# Patient Record
Sex: Male | Born: 1960 | Race: White | Hispanic: No | Marital: Married | Smoking: Current every day smoker
Health system: Southern US, Community
[De-identification: ages and names within clinical notes are randomized; demographics above are authoritative.]

## PROBLEM LIST (undated history)

## (undated) DIAGNOSIS — Z720 Tobacco use: Secondary | ICD-10-CM

## (undated) DIAGNOSIS — K8020 Calculus of gallbladder without cholecystitis without obstruction: Secondary | ICD-10-CM

## (undated) DIAGNOSIS — F32A Depression, unspecified: Secondary | ICD-10-CM

## (undated) DIAGNOSIS — Z7289 Other problems related to lifestyle: Secondary | ICD-10-CM

## (undated) DIAGNOSIS — F329 Major depressive disorder, single episode, unspecified: Secondary | ICD-10-CM

## (undated) DIAGNOSIS — R569 Unspecified convulsions: Secondary | ICD-10-CM

## (undated) DIAGNOSIS — F419 Anxiety disorder, unspecified: Secondary | ICD-10-CM

## (undated) DIAGNOSIS — K8000 Calculus of gallbladder with acute cholecystitis without obstruction: Secondary | ICD-10-CM

---

## 1999-07-18 ENCOUNTER — Ambulatory Visit (HOSPITAL_COMMUNITY): Admission: RE | Admit: 1999-07-18 | Discharge: 1999-07-18 | Payer: Self-pay | Admitting: Emergency Medicine

## 1999-07-18 ENCOUNTER — Encounter: Payer: Self-pay | Admitting: Emergency Medicine

## 2001-05-14 HISTORY — PX: TOTAL HIP ARTHROPLASTY: SHX124

## 2002-05-05 ENCOUNTER — Inpatient Hospital Stay (HOSPITAL_COMMUNITY): Admission: EM | Admit: 2002-05-05 | Discharge: 2002-05-08 | Payer: Self-pay | Admitting: Emergency Medicine

## 2002-05-05 ENCOUNTER — Encounter: Payer: Self-pay | Admitting: Emergency Medicine

## 2002-05-05 ENCOUNTER — Encounter: Payer: Self-pay | Admitting: Orthopedic Surgery

## 2002-11-01 ENCOUNTER — Encounter: Payer: Self-pay | Admitting: Emergency Medicine

## 2002-11-01 ENCOUNTER — Emergency Department (HOSPITAL_COMMUNITY): Admission: EM | Admit: 2002-11-01 | Discharge: 2002-11-01 | Payer: Self-pay | Admitting: Emergency Medicine

## 2004-10-23 ENCOUNTER — Encounter: Admission: RE | Admit: 2004-10-23 | Discharge: 2004-10-23 | Payer: Self-pay | Admitting: Orthopedic Surgery

## 2004-10-24 ENCOUNTER — Ambulatory Visit: Payer: Self-pay | Admitting: Physical Medicine and Rehabilitation

## 2004-10-24 ENCOUNTER — Encounter
Admission: RE | Admit: 2004-10-24 | Discharge: 2005-01-22 | Payer: Self-pay | Admitting: Physical Medicine and Rehabilitation

## 2004-11-24 ENCOUNTER — Encounter
Admission: RE | Admit: 2004-11-24 | Discharge: 2004-11-27 | Payer: Self-pay | Admitting: Physical Medicine and Rehabilitation

## 2010-10-25 ENCOUNTER — Emergency Department (HOSPITAL_COMMUNITY): Payer: Medicare Other

## 2010-10-25 ENCOUNTER — Other Ambulatory Visit: Payer: Self-pay | Admitting: Emergency Medicine

## 2010-10-25 ENCOUNTER — Emergency Department (HOSPITAL_COMMUNITY)
Admission: EM | Admit: 2010-10-25 | Discharge: 2010-10-25 | Disposition: A | Discharge status: Home / Self Care | Payer: Medicare Other | Attending: Emergency Medicine | Admitting: Emergency Medicine

## 2010-10-25 DIAGNOSIS — D72829 Elevated white blood cell count, unspecified: Secondary | ICD-10-CM | POA: Insufficient documentation

## 2010-10-25 DIAGNOSIS — R110 Nausea: Secondary | ICD-10-CM | POA: Insufficient documentation

## 2010-10-25 DIAGNOSIS — R1011 Right upper quadrant pain: Secondary | ICD-10-CM | POA: Insufficient documentation

## 2010-10-25 DIAGNOSIS — Z79899 Other long term (current) drug therapy: Secondary | ICD-10-CM | POA: Insufficient documentation

## 2010-10-25 DIAGNOSIS — G40909 Epilepsy, unspecified, not intractable, without status epilepticus: Secondary | ICD-10-CM | POA: Insufficient documentation

## 2010-10-25 DIAGNOSIS — K8020 Calculus of gallbladder without cholecystitis without obstruction: Secondary | ICD-10-CM | POA: Insufficient documentation

## 2010-10-25 LAB — COMPREHENSIVE METABOLIC PANEL
ALT: 19 U/L (ref 0–53)
AST: 15 U/L (ref 0–37)
Albumin: 4.2 g/dL (ref 3.5–5.2)
Alkaline Phosphatase: 265 U/L — ABNORMAL HIGH (ref 39–117)
BUN: 5 mg/dL — ABNORMAL LOW (ref 6–23)
CO2: 26 mEq/L (ref 19–32)
Calcium: 9.4 mg/dL (ref 8.4–10.5)
Chloride: 103 mEq/L (ref 96–112)
Creatinine, Ser: 0.6 mg/dL (ref 0.4–1.5)
GFR calc Af Amer: >60 mL/min (ref >60)
GFR calc non Af Amer: >60 mL/min (ref >60)
Glucose, Bld: 126 mg/dL — ABNORMAL HIGH (ref 70–99)
Potassium: 3.4 mEq/L — ABNORMAL LOW (ref 3.5–5.1)
Sodium: 138 mEq/L (ref 135–145)
Total Bilirubin: 0.2 mg/dL — ABNORMAL LOW (ref 0.3–1.2)
Total Protein: 7.8 g/dL (ref 6.0–8.3)

## 2010-10-25 LAB — DIFFERENTIAL
Basophils Absolute: 0.1 10*3/uL (ref 0.0–0.1)
Basophils Relative: 0 % (ref 0–1)
Eosinophils Absolute: 0.2 10*3/uL (ref 0.0–0.7)
Eosinophils Relative: 2 % (ref 0–5)
Lymphocytes Relative: 18 % (ref 12–46)
Lymphs Abs: 2.8 K/uL (ref 0.7–4.0)
Monocytes Absolute: 1.1 10*3/uL — ABNORMAL HIGH (ref 0.1–1.0)
Monocytes Relative: 8 % (ref 3–12)
Neutro Abs: 10.7 10*3/uL — ABNORMAL HIGH (ref 1.7–7.7)
Neutrophils Relative %: 72 % (ref 43–77)

## 2010-10-25 LAB — CBC
HCT: 41.4 % (ref 39.0–52.0)
Hemoglobin: 15 g/dL (ref 13.0–17.0)
MCH: 32.3 pg (ref 26.0–34.0)
MCHC: 36.2 g/dL — ABNORMAL HIGH (ref 30.0–36.0)
MCV: 89 fL (ref 78.0–100.0)
Platelets: 166 K/uL (ref 150–400)
RBC: 4.65 MIL/uL (ref 4.22–5.81)
RDW: 13.4 % (ref 11.5–15.5)
WBC: 14.9 10*3/uL — ABNORMAL HIGH (ref 4.0–10.5)

## 2010-10-25 LAB — URINALYSIS, ROUTINE W REFLEX MICROSCOPIC
Glucose, UA: NEGATIVE mg/dL
Ketones, ur: NEGATIVE mg/dL
pH: 6 (ref 5.0–8.0)

## 2010-10-25 LAB — URINE MICROSCOPIC-ADD ON

## 2010-10-25 LAB — LIPASE, BLOOD: Lipase: 18 U/L (ref 11–59)

## 2010-10-25 LAB — ETHANOL: Alcohol, Ethyl (B): <11 mg/dL — ABNORMAL HIGH (ref 0–10)

## 2010-10-25 MED ORDER — TECHNETIUM TC 99M MEBROFENIN IV KIT
5.0000 | PACK | Freq: Once | INTRAVENOUS | Status: AC | PRN
  Administered 2010-10-25: 5.5 via INTRAVENOUS

## 2010-11-09 NOTE — Consult Note (Signed)
NAMECLINTON, Aaron Vang NO.:  [id number]  MEDICAL RECORD NO.:  [id number]  LOCATION:  [facility]                         FACILITY:  [facility]  PHYSICIAN:  Ardeth Sportsman, MD     DATE OF BIRTH:  [birth year]  DATE OF CONSULTATION:  10/25/2010 DATE OF DISCHARGE:  10/25/2010                                CONSULTATION   REFERRING PHYSICIAN:  Gavin Pound. Ghim, MD  PHYSICIAN PRIMARY CARE:  None.  REASON FOR CONSULTATION:  Flank pain or right upper quadrant pain.  BRIEF HISTORY:  The patient is a 50 year old white male who presented with abdominal pain that started last night around midnight.  Pain is ongoing since midnight.  He denied nausea, vomiting, diarrhea, constipation, or GERD.  His last meal was about at 2:00 p.m.  Pain is only relieved by analgesics.  He has had a fair amount of analgesics. He has a history of a right trochanter femur fracture with postoperative anemia in 2004, history of seizure disorder since age 29, history of tobacco use, and alcohol dependence.  PAST SURGICAL HISTORY:  Right hip, 2004.  FAMILY HISTORY:  Mother is deceased.  He does not know the cause. Father and brother both living and "okay."  One sister is deceased, two living in good health.  SOCIAL HISTORY:  Tobacco, 2 packs a day for 40 years.  Alcohol, positive history and still drinking.  Drugs, none.  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  REVIEW OF SYSTEMS:  CONSTITUTIONAL:  Fever.  He said he had sweats but no documented fever.  SKIN:  No changes.  His last seizure was 12 years ago.  CVS:  Negative for syncope, presyncope, or stroke.  PULMONARY:  No orthopnea, PND, dyspnea on exertion, coughing or wheezing.  CARDIAC:  No chest pain.  GI:  Negative as above.  GU:  No trouble voiding.  LOWER EXTREMITIES:  No edema.  No claudication.  MUSCULOSKELETAL:  No joint problems.  He has chronic problems with his hip fracture site and walks with a cane.   ENDOCRINE:  No diabetes or thyroid issues.  CURRENT MEDICATIONS: 1. Dilantin 200 mg at bedtime. 2. Zoloft 100 mg at bedtime.  The patient has received a combination drugs including fentanyl, Toradol, and Dilaudid so far.  ALLERGIES:  None.  PHYSICAL EXAMINATION:  VITAL SIGNS:  Admission blood pressure is 150/76, repeated is again 150/76, heart rate is in the 50s, temperature is 98.6, respiratory rate was 20, and sats were 97% on room air. GENERAL:  On entering the room, the patient was kind of rolling back and forth in bed.  Next, I asked him if he had been drinking, he denied it at that time. HEAD:  Normocephalic. EARS, NOSE, THROAT, AND MOUTH:  Within normal limits. NECK:  Trachea is in the midline.  No palpable thyromegaly, no rales, or rhonchi. CARDIAC:  Normal S1-S2.  No murmur or rub.  Pulses are +2 and equal. ABDOMEN:  Bowel sounds are present.  Abdomen is nondistended.  He has tenderness which is kind of mid-lateral right upper quadrant tenderness. He is not tender in the right upper quadrant where we  expect his gallbladder tend to be.  Hernias none, masses none, and abscesses none. GU/RECTAL:  Deferred. LYMPHADENOPATHY:  None palpated, cervical or inguinal. MUSCULOSKELETAL:  No joint changes noted. SKIN:  No changes. NEUROLOGIC:  He is oriented and answering questions.  No focal changes noted.  Cranial nerves II-XII are grossly normal. PSYCH:  He has a slightly abnormal affect but as noted above has received fair amount of narcotics.  LABORATORY DATA:  White count is 14.9, hemoglobin is 15, hematocrit is 41, and platelets 166,000.  Total bilirubin 0.2, alk phos is elevated at 265, SGOT is 15, and SGPT is 19.  Sodium is 138, potassium is 3.4, chloride is 103, CO2 is 26, creatinine is 0.6, and BUN is 5.  UA showed 3-6 white cells and 0-2 red cells.  Lipase was 18.  A noncontrast CT showed hydropic gallbladder.  No calcified stones identified.  Common bile duct appeared  normal without dilatation.  The spleen was normal. Both adrenals were normal.  There was some nonspecific bilateral perinephric stranding.  Dr. Michaell Cowing reviewed the CT with Radiology and it was not very impressive.  A ultrasound of the abdomen and HIDA scan were recommended with plans to followup after the ER has completed those studies.  We do note that the ultrasound was obtained and showed multiple gallstones, the gallbladder wall was normal, there was no pericholecystic fluid, Murphy sign was normal, and common bile duct was normal.  IMPRESSION:  Abdominal pain with dilated gallbladder, elevated white count, and alk phos.  When we saw him, we asked the ER doctor to obtain abdominal ultrasound and HIDA scan for more information with plans to followup after those are completed.     Eber Hong, P.A.   ______________________________ Ardeth Sportsman, MD    WDJ/MEDQ  D:  10/25/2010  T:  10/25/2010  Job:  [id number]  Electronically Signed by Sherrie George P.A. on 11/03/2010 09:35:21 PM Electronically Signed by Karie Soda MD on 11/09/2010 07:14:03 AM

## 2010-11-14 ENCOUNTER — Ambulatory Visit (INDEPENDENT_AMBULATORY_CARE_PROVIDER_SITE_OTHER): Payer: Medicare Other | Admitting: General Surgery

## 2011-12-31 ENCOUNTER — Inpatient Hospital Stay (HOSPITAL_COMMUNITY): Payer: Medicare Other

## 2011-12-31 ENCOUNTER — Encounter (HOSPITAL_COMMUNITY)
Admission: EM | Disposition: A | Discharge status: Home / Self Care | Payer: Self-pay | Source: Home / Self Care | Attending: General Surgery

## 2011-12-31 ENCOUNTER — Inpatient Hospital Stay (HOSPITAL_COMMUNITY)
Admission: EM | Admit: 2011-12-31 | Discharge: 2012-01-01 | DRG: 418 | Disposition: A | Discharge status: Home / Self Care | Payer: Medicare Other | Attending: General Surgery | Admitting: General Surgery

## 2011-12-31 ENCOUNTER — Encounter (HOSPITAL_COMMUNITY): Payer: Self-pay | Admitting: Critical Care Medicine

## 2011-12-31 ENCOUNTER — Encounter (HOSPITAL_COMMUNITY): Payer: Self-pay | Admitting: *Deleted

## 2011-12-31 ENCOUNTER — Encounter (HOSPITAL_COMMUNITY): Payer: Self-pay | Admitting: General Surgery

## 2011-12-31 ENCOUNTER — Inpatient Hospital Stay (HOSPITAL_COMMUNITY): Payer: Medicare Other | Admitting: Critical Care Medicine

## 2011-12-31 ENCOUNTER — Emergency Department (HOSPITAL_COMMUNITY): Payer: Medicare Other

## 2011-12-31 DIAGNOSIS — F1010 Alcohol abuse, uncomplicated: Secondary | ICD-10-CM | POA: Diagnosis present

## 2011-12-31 DIAGNOSIS — F329 Major depressive disorder, single episode, unspecified: Secondary | ICD-10-CM | POA: Diagnosis present

## 2011-12-31 DIAGNOSIS — Z79899 Other long term (current) drug therapy: Secondary | ICD-10-CM

## 2011-12-31 DIAGNOSIS — K821 Hydrops of gallbladder: Secondary | ICD-10-CM | POA: Diagnosis present

## 2011-12-31 DIAGNOSIS — Z8659 Personal history of other mental and behavioral disorders: Secondary | ICD-10-CM

## 2011-12-31 DIAGNOSIS — G40909 Epilepsy, unspecified, not intractable, without status epilepticus: Secondary | ICD-10-CM | POA: Diagnosis present

## 2011-12-31 DIAGNOSIS — Z7289 Other problems related to lifestyle: Secondary | ICD-10-CM

## 2011-12-31 DIAGNOSIS — Z8669 Personal history of other diseases of the nervous system and sense organs: Secondary | ICD-10-CM

## 2011-12-31 DIAGNOSIS — K8000 Calculus of gallbladder with acute cholecystitis without obstruction: Principal | ICD-10-CM | POA: Diagnosis present

## 2011-12-31 DIAGNOSIS — F3289 Other specified depressive episodes: Secondary | ICD-10-CM | POA: Diagnosis present

## 2011-12-31 DIAGNOSIS — F32A Depression, unspecified: Secondary | ICD-10-CM | POA: Diagnosis present

## 2011-12-31 DIAGNOSIS — Z789 Other specified health status: Secondary | ICD-10-CM

## 2011-12-31 DIAGNOSIS — Z720 Tobacco use: Secondary | ICD-10-CM | POA: Diagnosis present

## 2011-12-31 DIAGNOSIS — K8010 Calculus of gallbladder with chronic cholecystitis without obstruction: Secondary | ICD-10-CM

## 2011-12-31 DIAGNOSIS — K812 Acute cholecystitis with chronic cholecystitis: Secondary | ICD-10-CM

## 2011-12-31 DIAGNOSIS — E669 Obesity, unspecified: Secondary | ICD-10-CM | POA: Diagnosis present

## 2011-12-31 DIAGNOSIS — F411 Generalized anxiety disorder: Secondary | ICD-10-CM | POA: Diagnosis present

## 2011-12-31 DIAGNOSIS — F17200 Nicotine dependence, unspecified, uncomplicated: Secondary | ICD-10-CM | POA: Diagnosis present

## 2011-12-31 DIAGNOSIS — F1090 Alcohol use, unspecified, uncomplicated: Secondary | ICD-10-CM

## 2011-12-31 HISTORY — DX: Other problems related to lifestyle: Z72.89

## 2011-12-31 HISTORY — DX: Calculus of gallbladder without cholecystitis without obstruction: K80.20

## 2011-12-31 HISTORY — DX: Depression, unspecified: F32.A

## 2011-12-31 HISTORY — DX: Alcohol use, unspecified, uncomplicated: F10.90

## 2011-12-31 HISTORY — DX: Other specified health status: Z78.9

## 2011-12-31 HISTORY — DX: Tobacco use: Z72.0

## 2011-12-31 HISTORY — DX: Anxiety disorder, unspecified: F41.9

## 2011-12-31 HISTORY — DX: Calculus of gallbladder with acute cholecystitis without obstruction: K80.00

## 2011-12-31 HISTORY — DX: Major depressive disorder, single episode, unspecified: F32.9

## 2011-12-31 HISTORY — DX: Unspecified convulsions: R56.9

## 2011-12-31 HISTORY — PX: CHOLECYSTECTOMY: SHX55

## 2011-12-31 LAB — CBC WITH DIFFERENTIAL/PLATELET
Basophils Absolute: 0 10*3/uL (ref 0.0–0.1)
HCT: 40.4 % (ref 39.0–52.0)
Lymphocytes Relative: 17 % (ref 12–46)
Monocytes Absolute: 0.6 10*3/uL (ref 0.1–1.0)
Neutro Abs: 9.8 10*3/uL — ABNORMAL HIGH (ref 1.7–7.7)
Neutrophils Relative %: 77 % (ref 43–77)
RDW: 14.2 % (ref 11.5–15.5)
WBC: 12.8 10*3/uL — ABNORMAL HIGH (ref 4.0–10.5)

## 2011-12-31 LAB — HEPATIC FUNCTION PANEL
ALT: 12 U/L (ref 0–53)
AST: 16 U/L (ref 0–37)
Albumin: 4.2 g/dL (ref 3.5–5.2)
Total Protein: 8 g/dL (ref 6.0–8.3)

## 2011-12-31 LAB — POCT I-STAT, CHEM 8
BUN: 5 mg/dL — ABNORMAL LOW (ref 6–23)
Chloride: 102 mEq/L (ref 96–112)
Creatinine, Ser: 0.7 mg/dL (ref 0.50–1.35)
Glucose, Bld: 121 mg/dL — ABNORMAL HIGH (ref 70–99)
Potassium: 3.6 mEq/L (ref 3.5–5.1)

## 2011-12-31 LAB — URINALYSIS, ROUTINE W REFLEX MICROSCOPIC
Bilirubin Urine: NEGATIVE
Specific Gravity, Urine: 1.006 (ref 1.005–1.030)
pH: 6 (ref 5.0–8.0)

## 2011-12-31 LAB — URINE MICROSCOPIC-ADD ON

## 2011-12-31 SURGERY — LAPAROSCOPIC CHOLECYSTECTOMY WITH INTRAOPERATIVE CHOLANGIOGRAM
Anesthesia: General | Site: Abdomen | Wound class: Contaminated

## 2011-12-31 MED ORDER — HYDROMORPHONE HCL PF 1 MG/ML IJ SOLN
INTRAMUSCULAR | Status: AC
  Filled 2011-12-31: qty 1

## 2011-12-31 MED ORDER — ONDANSETRON HCL 4 MG/2ML IJ SOLN
INTRAMUSCULAR | Status: DC | PRN
  Administered 2011-12-31: 4 mg via INTRAVENOUS

## 2011-12-31 MED ORDER — GLYCOPYRROLATE 0.2 MG/ML IJ SOLN
INTRAMUSCULAR | Status: DC | PRN
  Administered 2011-12-31: .7 mg via INTRAVENOUS
  Administered 2011-12-31: 0.1 mg via INTRAVENOUS

## 2011-12-31 MED ORDER — ONDANSETRON HCL 4 MG/2ML IJ SOLN
4.0000 mg | Freq: Once | INTRAMUSCULAR | Status: AC
  Administered 2011-12-31: 4 mg via INTRAVENOUS
  Filled 2011-12-31: qty 2

## 2011-12-31 MED ORDER — CIPROFLOXACIN IN D5W 400 MG/200ML IV SOLN
400.0000 mg | Freq: Two times a day (BID) | INTRAVENOUS | Status: DC
  Administered 2011-12-31: 400 mg via INTRAVENOUS
  Filled 2011-12-31 (×2): qty 200

## 2011-12-31 MED ORDER — LIDOCAINE HCL (CARDIAC) 20 MG/ML IV SOLN
INTRAVENOUS | Status: DC | PRN
  Administered 2011-12-31: 80 mg via INTRAVENOUS

## 2011-12-31 MED ORDER — DIPHENHYDRAMINE HCL 50 MG/ML IJ SOLN: 12.5000 mg | Freq: Four times a day (QID) | INTRAMUSCULAR | Status: DC | PRN

## 2011-12-31 MED ORDER — FENTANYL CITRATE 0.05 MG/ML IJ SOLN
INTRAMUSCULAR | Status: DC | PRN
  Administered 2011-12-31: 50 ug via INTRAVENOUS
  Administered 2011-12-31 (×2): 100 ug via INTRAVENOUS

## 2011-12-31 MED ORDER — HYDROMORPHONE HCL PF 1 MG/ML IJ SOLN
0.2500 mg | INTRAMUSCULAR | Status: DC | PRN
  Administered 2011-12-31 (×4): 0.5 mg via INTRAVENOUS

## 2011-12-31 MED ORDER — PROMETHAZINE HCL 25 MG/ML IJ SOLN: 6.2500 mg | INTRAMUSCULAR | Status: DC | PRN

## 2011-12-31 MED ORDER — ACETAMINOPHEN 325 MG PO TABS
650.0000 mg | ORAL_TABLET | Freq: Four times a day (QID) | ORAL | Status: DC | PRN
  Administered 2011-12-31 (×2): 650 mg via ORAL
  Filled 2011-12-31 (×2): qty 2

## 2011-12-31 MED ORDER — BUPIVACAINE-EPINEPHRINE PF 0.25-1:200000 % IJ SOLN
INTRAMUSCULAR | Status: AC
  Filled 2011-12-31: qty 30

## 2011-12-31 MED ORDER — SERTRALINE HCL 100 MG PO TABS
100.0000 mg | ORAL_TABLET | Freq: Every day | ORAL | Status: DC
  Administered 2011-12-31: 100 mg via ORAL
  Filled 2011-12-31 (×2): qty 1

## 2011-12-31 MED ORDER — NICOTINE 14 MG/24HR TD PT24
14.0000 mg | MEDICATED_PATCH | Freq: Every day | TRANSDERMAL | Status: DC
  Administered 2011-12-31 – 2012-01-01 (×2): 14 mg via TRANSDERMAL
  Filled 2011-12-31 (×2): qty 1

## 2011-12-31 MED ORDER — HYDROMORPHONE HCL PF 1 MG/ML IJ SOLN
0.5000 mg | INTRAMUSCULAR | Status: DC | PRN
  Administered 2011-12-31: 1 mg via INTRAVENOUS
  Filled 2011-12-31: qty 1

## 2011-12-31 MED ORDER — LACTATED RINGERS IV SOLN
INTRAVENOUS | Status: DC
  Administered 2011-12-31: 11:00:00 via INTRAVENOUS

## 2011-12-31 MED ORDER — PHENYTOIN SODIUM EXTENDED 100 MG PO CAPS
100.0000 mg | ORAL_CAPSULE | Freq: Two times a day (BID) | ORAL | Status: DC
  Administered 2011-12-31: 100 mg via ORAL
  Filled 2011-12-31 (×3): qty 1

## 2011-12-31 MED ORDER — SODIUM CHLORIDE 0.9 % IV SOLN
Freq: Once | INTRAVENOUS | Status: AC
  Administered 2011-12-31: 1000 mL via INTRAVENOUS

## 2011-12-31 MED ORDER — PROPOFOL 10 MG/ML IV EMUL
INTRAVENOUS | Status: DC | PRN
  Administered 2011-12-31: 180 mg via INTRAVENOUS

## 2011-12-31 MED ORDER — MEPERIDINE HCL 25 MG/ML IJ SOLN: 6.2500 mg | INTRAMUSCULAR | Status: DC | PRN

## 2011-12-31 MED ORDER — LORAZEPAM 2 MG/ML IJ SOLN: 0.5000 mg | Freq: Four times a day (QID) | INTRAMUSCULAR | Status: DC | PRN

## 2011-12-31 MED ORDER — LACTATED RINGERS IV SOLN
INTRAVENOUS | Status: DC | PRN
  Administered 2011-12-31: 11:00:00 via INTRAVENOUS

## 2011-12-31 MED ORDER — CIPROFLOXACIN IN D5W 400 MG/200ML IV SOLN
400.0000 mg | Freq: Two times a day (BID) | INTRAVENOUS | Status: AC
  Administered 2011-12-31 – 2012-01-01 (×2): 400 mg via INTRAVENOUS
  Filled 2011-12-31 (×2): qty 200

## 2011-12-31 MED ORDER — 0.9 % SODIUM CHLORIDE (POUR BTL) OPTIME
TOPICAL | Status: DC | PRN
  Administered 2011-12-31: 1000 mL

## 2011-12-31 MED ORDER — KCL IN DEXTROSE-NACL 30-5-0.45 MEQ/L-%-% IV SOLN
INTRAVENOUS | Status: DC
  Administered 2011-12-31: 16:00:00 via INTRAVENOUS
  Administered 2012-01-01: 1000 mL via INTRAVENOUS
  Filled 2011-12-31 (×4): qty 1000

## 2011-12-31 MED ORDER — SUCCINYLCHOLINE CHLORIDE 20 MG/ML IJ SOLN
INTRAMUSCULAR | Status: DC | PRN
  Administered 2011-12-31: 100 mg via INTRAVENOUS

## 2011-12-31 MED ORDER — HYDROMORPHONE HCL PF 1 MG/ML IJ SOLN
1.0000 mg | Freq: Once | INTRAMUSCULAR | Status: AC
  Administered 2011-12-31: 1 mg via INTRAVENOUS
  Filled 2011-12-31: qty 1

## 2011-12-31 MED ORDER — DIPHENHYDRAMINE HCL 12.5 MG/5ML PO ELIX
12.5000 mg | ORAL_SOLUTION | Freq: Four times a day (QID) | ORAL | Status: DC | PRN
  Filled 2011-12-31: qty 5

## 2011-12-31 MED ORDER — MIDAZOLAM HCL 5 MG/5ML IJ SOLN
INTRAMUSCULAR | Status: DC | PRN
  Administered 2011-12-31: 2 mg via INTRAVENOUS

## 2011-12-31 MED ORDER — SODIUM CHLORIDE 0.9 % IV SOLN
INTRAVENOUS | Status: DC | PRN
  Administered 2011-12-31: 12:00:00

## 2011-12-31 MED ORDER — NEOSTIGMINE METHYLSULFATE 1 MG/ML IJ SOLN
INTRAMUSCULAR | Status: DC | PRN
  Administered 2011-12-31: 4 mg via INTRAVENOUS

## 2011-12-31 MED ORDER — HYDROCODONE-ACETAMINOPHEN 5-325 MG PO TABS
1.0000 | ORAL_TABLET | ORAL | Status: DC | PRN
  Administered 2012-01-01: 2 via ORAL
  Filled 2011-12-31: qty 2

## 2011-12-31 MED ORDER — BUPIVACAINE-EPINEPHRINE 0.25% -1:200000 IJ SOLN
INTRAMUSCULAR | Status: DC | PRN
  Administered 2011-12-31: 16 mL

## 2011-12-31 MED ORDER — SODIUM CHLORIDE 0.9 % IR SOLN
Status: DC | PRN
  Administered 2011-12-31: 1000 mL

## 2011-12-31 MED ORDER — ROCURONIUM BROMIDE 100 MG/10ML IV SOLN
INTRAVENOUS | Status: DC | PRN
  Administered 2011-12-31: 30 mg via INTRAVENOUS

## 2011-12-31 MED ORDER — ONDANSETRON HCL 4 MG/2ML IJ SOLN: 4.0000 mg | Freq: Four times a day (QID) | INTRAMUSCULAR | Status: DC | PRN

## 2011-12-31 MED ORDER — ACETAMINOPHEN 650 MG RE SUPP: 650.0000 mg | Freq: Four times a day (QID) | RECTAL | Status: DC | PRN

## 2011-12-31 SURGICAL SUPPLY — 54 items
ADH SKN CLS APL DERMABOND .7 (GAUZE/BANDAGES/DRESSINGS)
ADH SKN CLS LQ APL DERMABOND (GAUZE/BANDAGES/DRESSINGS) ×1
APPLIER CLIP 5 13 M/L LIGAMAX5 (MISCELLANEOUS) ×2
APPLIER CLIP ROT 10 11.4 M/L (STAPLE)
APR CLP MED LRG 11.4X10 (STAPLE)
APR CLP MED LRG 5 ANG JAW (MISCELLANEOUS) ×1
BAG SPEC RTRVL LRG 6X4 10 (ENDOMECHANICALS) ×1
BLADE SURG ROTATE 9660 (MISCELLANEOUS) ×1 IMPLANT
CANISTER SUCTION 2500CC (MISCELLANEOUS) ×2 IMPLANT
CHLORAPREP W/TINT 26ML (MISCELLANEOUS) ×2 IMPLANT
CLIP APPLIE 5 13 M/L LIGAMAX5 (MISCELLANEOUS) ×1 IMPLANT
CLIP APPLIE ROT 10 11.4 M/L (STAPLE) IMPLANT
CLOTH BEACON ORANGE TIMEOUT ST (SAFETY) ×2 IMPLANT
COVER MAYO STAND STRL (DRAPES) ×2 IMPLANT
COVER SURGICAL LIGHT HANDLE (MISCELLANEOUS) ×2 IMPLANT
DECANTER SPIKE VIAL GLASS SM (MISCELLANEOUS) ×2 IMPLANT
DERMABOND ADHESIVE PROPEN (GAUZE/BANDAGES/DRESSINGS) ×1
DERMABOND ADVANCED (GAUZE/BANDAGES/DRESSINGS)
DERMABOND ADVANCED .7 DNX12 (GAUZE/BANDAGES/DRESSINGS) ×1 IMPLANT
DERMABOND ADVANCED .7 DNX6 (GAUZE/BANDAGES/DRESSINGS) IMPLANT
DRAPE C-ARM 42X72 X-RAY (DRAPES) ×2 IMPLANT
DRAPE UTILITY 15X26 W/TAPE STR (DRAPE) ×4 IMPLANT
ELECT REM PT RETURN 9FT ADLT (ELECTROSURGICAL) ×2
ELECTRODE REM PT RTRN 9FT ADLT (ELECTROSURGICAL) ×1 IMPLANT
FILTER SMOKE EVAC LAPAROSHD (FILTER) IMPLANT
GLOVE BIO SURGEON STRL SZ8 (GLOVE) ×2 IMPLANT
GLOVE BIOGEL PI IND STRL 7.0 (GLOVE) IMPLANT
GLOVE BIOGEL PI IND STRL 7.5 (GLOVE) IMPLANT
GLOVE BIOGEL PI IND STRL 8 (GLOVE) ×1 IMPLANT
GLOVE BIOGEL PI INDICATOR 7.0 (GLOVE) ×2
GLOVE BIOGEL PI INDICATOR 7.5 (GLOVE) ×1
GLOVE BIOGEL PI INDICATOR 8 (GLOVE) ×1
GLOVE ECLIPSE 6.5 STRL STRAW (GLOVE) ×1 IMPLANT
GLOVE SURG SS PI 7.0 STRL IVOR (GLOVE) ×1 IMPLANT
GOWN PREVENTION PLUS XLARGE (GOWN DISPOSABLE) ×2 IMPLANT
GOWN STRL NON-REIN LRG LVL3 (GOWN DISPOSABLE) ×5 IMPLANT
KIT BASIN OR (CUSTOM PROCEDURE TRAY) ×2 IMPLANT
KIT ROOM TURNOVER OR (KITS) ×2 IMPLANT
NS IRRIG 1000ML POUR BTL (IV SOLUTION) ×2 IMPLANT
PAD ARMBOARD 7.5X6 YLW CONV (MISCELLANEOUS) ×2 IMPLANT
POUCH SPECIMEN RETRIEVAL 10MM (ENDOMECHANICALS) ×2 IMPLANT
SCISSORS LAP 5X35 DISP (ENDOMECHANICALS) ×1 IMPLANT
SET CHOLANGIOGRAPH 5 50 .035 (SET/KITS/TRAYS/PACK) ×2 IMPLANT
SET IRRIG TUBING LAPAROSCOPIC (IRRIGATION / IRRIGATOR) ×2 IMPLANT
SLEEVE ADV FIXATION 5X100MM (TROCAR) ×4 IMPLANT
SPECIMEN JAR SMALL (MISCELLANEOUS) ×2 IMPLANT
SUT VIC AB 4-0 PS2 27 (SUTURE) ×2 IMPLANT
TOWEL OR 17X24 6PK STRL BLUE (TOWEL DISPOSABLE) ×2 IMPLANT
TOWEL OR 17X26 10 PK STRL BLUE (TOWEL DISPOSABLE) ×2 IMPLANT
TRAY LAPAROSCOPIC (CUSTOM PROCEDURE TRAY) ×2 IMPLANT
TROCAR HASSON GELL 12X100 (TROCAR) ×2 IMPLANT
TROCAR Z-THREAD FIOS 11X100 BL (TROCAR) IMPLANT
TROCAR Z-THREAD FIOS 5X100MM (TROCAR) ×4 IMPLANT
WATER STERILE IRR 1000ML POUR (IV SOLUTION) IMPLANT

## 2011-12-31 NOTE — Preoperative (Signed)
Beta Blockers   Reason not to administer Beta Blockers:Not Applicable 

## 2011-12-31 NOTE — ED Provider Notes (Signed)
Medical screening examination/treatment/procedure(s) were performed by non-physician practitioner and as supervising physician I was immediately available for consultation/collaboration.   Lyanne Co, MD 12/31/11 [phone]

## 2011-12-31 NOTE — Discharge Summary (Signed)
Physician Discharge Summary  Patient ID: Aaron Vang MRN: [id number] DOB/AGE: 51/22/1962 51 y.o.  Admit date: 12/31/2011 Discharge date: 01/01/2012  Admission Diagnoses: 1.Acute cholecystitis with cholelithiasis 2. History of seizures none for 20 years on chronic Dilantin.  3. History of anxiety and depression; some memory issues. On chronic Zoloft.  4. History of alcohol use  5. Ongoing tobacco use, 80+ pack year.  Discharge Diagnoses: Same Principal Problem:  *Cholecystitis, acute with cholelithiasis Active Problems:  Hx of seizure disorder  Hx of anxiety disorder  Depression  Alcohol use  Tobacco use   PROCEDURES: Laparoscopic cholecystectomy with IOC 12/29/11 Dr. Gloris Ham Course:  Patient is a 51 year old gentleman who presents to the ER. He started having pain around 7:30 PM. He last eaten at 3 PM. He went to bed at 9:00 after taking a couple ibuprofen. His symptoms did not improve he got up and took more ibuprofen and later in the evening early morning took something that his dosage given for pain. Pain was ongoing he came to the ER around 6:30 this morning. Even with IV Dilaudid he continues to have pain, and his right upper quadrant going to his back. He is extremely tender right upper quadrant. He was seen in June 2000 12th a similar problem ultrasound 10/25/2010 shows multiple gallstones with a normal gallbladder wall negative Murphy's sign common bile duct was 4 mm. There was a small hemangioma noted on the liver portion of the exam. A HIDA scan showed no uptake by the gallbladder faint distention with morphine which was suggested chronic cholecystitis. It was recommended he follow up as an outpatient this did not occur. He's had a similar episode 3-4 months ago, and now this is his third episode which is his worst. Workup in the emergency room shows a white count of 12,800. Alkaline phosphatase elevated at 198. Lipase is normal at 17. Ultrasound shows multiple  gallstones within the gallbladder under 1 cm in size no wall thickening patient does have a sonographic Murphy's sign. No pericholecystic fluid. Common bile duct was 7 mm no ductal stones identified he is a 9 mm simple cyst of the liver central portion of centimeter hyperechoic focus likely direct present a small hemangioma was their impression and gallbladder stones and sludge with a positive Murphy sign. He's been seen and evaluated by Dr. Janee Morn, who agrees he should undergo cholecystectomy today. Risks and benefits were discussed the patient is agreeable. He has a sister and his ministers in the room. Patient and sister agreeable to surgery. He was taken to the OR later that day and tolerated the procedure well.  His diet is being advanced and if he does well we hope to send home later today.  Condition on D/C:  improved  Disposition: 01-Home or Self Care   Medication List  As of 01/01/2012  8:54 AM   TAKE these medications         acetaminophen 325 MG tablet   Commonly known as: TYLENOL   Take 2 tablets (650 mg total) by mouth every 6 (six) hours as needed (or Temp > 100).      HYDROcodone-acetaminophen 5-325 MG per tablet   Commonly known as: NORCO/VICODIN   Take 1-2 tablets by mouth every 4 (four) hours as needed (pain).      ibuprofen 200 MG tablet   Commonly known as: ADVIL,MOTRIN   Take 600 mg by mouth every 6 (six) hours as needed. For pain      phenytoin 100  MG ER capsule   Commonly known as: DILANTIN   Take 100 mg by mouth 2 (two) times daily.      sertraline 100 MG tablet   Commonly known as: ZOLOFT   Take 100 mg by mouth daily.      ZYRTEC ALLERGY 10 MG tablet   Generic drug: cetirizine   Take 10 mg by mouth 2 (two) times daily.           Follow-up Information    Follow up with [facility], MD. Schedule an appointment as soon as possible for a visit in 2 weeks. (Call for an appointment in 2-3 weeks)    Contact information:   [address] [place] [address] [phone]       Follow up with [facility], MD. (Call Dr. Acquanetta Belling and let him know you have had this surgery, and that you are having trouble with your anxiety and depression.)    Contact information:   [address] [place] [address] [phone]          Signed: Sherrie George 01/01/2012, 8:54 AM

## 2011-12-31 NOTE — Anesthesia Preprocedure Evaluation (Addendum)
Anesthesia Evaluation  Patient identified by MRN, date of birth, ID band Patient awake    Reviewed: Allergy & Precautions, H&P , NPO status , Patient's Chart, lab work & pertinent test results  History of Anesthesia Complications Negative for: history of anesthetic complications  Airway Mallampati: II  Neck ROM: Full    Dental  (+) Poor Dentition, Chipped and Missing Multiple rotten teeth:   Pulmonary Current Smoker,  breath sounds clear to auscultation        Cardiovascular negative cardio ROS  Rhythm:Regular Rate:Normal     Neuro/Psych Seizures -,  PSYCHIATRIC DISORDERS Anxiety Depression    GI/Hepatic Neg liver ROS,   Endo/Other  negative endocrine ROS  Renal/GU negative Renal ROS     Musculoskeletal   Abdominal (+) + obese,   Peds  Hematology negative hematology ROS (+)   Anesthesia Other Findings   Reproductive/Obstetrics                          Anesthesia Physical Anesthesia Plan  ASA: II and Emergent  Anesthesia Plan: General   Post-op Pain Management:    Induction: Intravenous  Airway Management Planned: Oral ETT  Additional Equipment:   Intra-op Plan:   Post-operative Plan: Extubation in OR  Informed Consent: I have reviewed the patients History and Physical, chart, labs and discussed the procedure including the risks, benefits and alternatives for the proposed anesthesia with the patient or authorized representative who has indicated his/her understanding and acceptance.   Dental advisory given  Plan Discussed with: Anesthesiologist, Surgeon and CRNA  Anesthesia Plan Comments:        Anesthesia Quick Evaluation

## 2011-12-31 NOTE — Transfer of Care (Signed)
Immediate Anesthesia Transfer of Care Note  Patient: Aaron Vang  Procedure(s) Performed: Procedure(s) (LRB): LAPAROSCOPIC CHOLECYSTECTOMY WITH INTRAOPERATIVE CHOLANGIOGRAM (N/A)  Patient Location: PACU  Anesthesia Type: General  Level of Consciousness: awake and alert   Airway & Oxygen Therapy: Patient Spontanous Breathing and Patient connected to nasal cannula oxygen  Post-op Assessment: Report given to PACU RN, Post -op Vital signs reviewed and stable and Patient moving all extremities X 4  Post vital signs: Reviewed and stable  Complications: No apparent anesthesia complications

## 2011-12-31 NOTE — H&P (Signed)
I discussed the procedure in detail.  We discussed the risks and benefits of a laparoscopic cholecystectomy and possible cholangiogram including, but not limited to bleeding, infection, injury to surrounding structures such as the intestine or liver, bile leak, retained gallstones, need to convert to an open procedure, prolonged diarrhea, blood clots such as  DVT, common bile duct injury, anesthesia risks, and possible need for additional procedures.  The likelihood of improvement in symptoms and return to the patient's normal status is good. We discussed the typical post-operative recovery course. Patient examined and I agree with the assessment and plan  Violeta Gelinas, MD, MPH, FACS Pager: [phone]  12/31/2011 11:00 AM

## 2011-12-31 NOTE — ED Notes (Signed)
Pt states he was told a month and a half ago that he had gallstones and needed his gallbladder removed. Pt states that he did not, pt right upper quadrant pain has been getting worse since yesterday.

## 2011-12-31 NOTE — ED Notes (Signed)
MD at bedside. 

## 2011-12-31 NOTE — Anesthesia Postprocedure Evaluation (Signed)
  Anesthesia Post-op Note  Patient: Aaron Vang  Procedure(s) Performed: Procedure(s) (LRB): LAPAROSCOPIC CHOLECYSTECTOMY WITH INTRAOPERATIVE CHOLANGIOGRAM (N/A)  Patient Location: PACU  Anesthesia Type: General  Level of Consciousness: awake  Airway and Oxygen Therapy: Patient Spontanous Breathing  Post-op Pain: mild  Post-op Assessment: Post-op Vital signs reviewed  Post-op Vital Signs: stable  Complications: No apparent anesthesia complications

## 2011-12-31 NOTE — H&P (Signed)
Aaron Vang is an 51 y.o. male.   Primary Care: [name] Chief Complaint: Abdominal pain HPI: Patient is a 51 year old gentleman who presents to the ER. He started having pain around 7:30 PM. He last eaten at 3 PM. He went to bed at 9:00 after taking a couple ibuprofen. His symptoms did not improve he got up and took more ibuprofen and later in the evening early morning took something that his dosage given for pain. Pain was ongoing he came to the ER around 6:30 this morning. Even with IV Dilaudid he continues to have pain, and his right upper quadrant going to his back. He is extremely tender right upper quadrant. He was seen in June 2000 12th a similar problem ultrasound 10/25/2010 shows multiple gallstones with a normal gallbladder wall negative Murphy's sign common bile duct was 4 mm. There was a small hemangioma noted on the liver portion of the exam. A HIDA scan showed no uptake by the gallbladder faint distention with morphine which was suggested chronic cholecystitis. It was recommended he follow up as an outpatient this did not occur. He's had a similar episode 3-4 months ago, and now this is his third episode which is his worst. Workup in the emergency room shows a white count of 12,800. Alkaline phosphatase elevated at 198. Lipase is normal at 17. Ultrasound shows multiple gallstones within the gallbladder under 1 cm in size no wall thickening patient does  have a sonographic Murphy's sign. No pericholecystic fluid. Common bile duct was 7 mm no ductal stones identified he is a 9 mm simple cyst of the liver central portion of centimeter hyperechoic focus likely direct present a small hemangioma was their impression and gallbladder stones and sludge with a positive Murphy sign. He's been seen and evaluated by Dr. Janee Morn, who agrees he should undergo cholecystectomy today. Risks and benefits were discussed the patient is agreeable. He has a sister and his ministers in the room. Patient and  sister agreeable to surgery.  Past Medical History  Diagnosis Date  . Gall stones   . Depression/anxiety  Worse since fathers death 09-26-2011.   . Seizures since childhood, with some siblings also with seizures. No seizure for 20 years.     History reviewed. No pertinent past surgical history. Right hip fracture with ongoing difficulty, and on disability for this injury.  History reviewed. No pertinent family history. Social History:  reports that he has been smoking.  He does not have any smokeless tobacco history on file. He reports that he drinks alcohol. He reports that he does not use illicit drugs.  Allergies: No Known Allergies Prior to Admission medications   Medication Sig Start Date End Date Taking? Authorizing Provider  cetirizine (ZYRTEC ALLERGY) 10 MG tablet Take 10 mg by mouth 2 (two) times daily.   Yes Historical Provider, MD  ibuprofen (ADVIL,MOTRIN) 200 MG tablet Take 600 mg by mouth every 6 (six) hours as needed. For pain   Yes Historical Provider, MD  phenytoin (DILANTIN) 100 MG ER capsule Take 100 mg by mouth 2 (two) times daily.   Yes Historical Provider, MD  sertraline (ZOLOFT) 100 MG tablet Take 100 mg by mouth daily.   Yes Historical Provider, MD     (Not in a hospital admission)  Results for orders placed during the hospital encounter of 12/31/11 (from the past 48 hour(s))  CBC WITH DIFFERENTIAL     Status: Abnormal   Collection Time   12/31/11  6:08 AM  Component Value Range Comment   WBC 12.8 (*) 4.0 - 10.5 K/uL    RBC 4.35  4.22 - 5.81 MIL/uL    Hemoglobin 14.1  13.0 - 17.0 g/dL    HCT 57.8  46.9 - 62.9 %    MCV 92.9  78.0 - 100.0 fL    MCH 32.4  26.0 - 34.0 pg    MCHC 34.9  30.0 - 36.0 g/dL    RDW 52.8  41.3 - 24.4 %    Platelets 187  150 - 400 K/uL    Neutrophils Relative 77  43 - 77 %    Neutro Abs 9.8 (*) 1.7 - 7.7 K/uL    Lymphocytes Relative 17  12 - 46 %    Lymphs Abs 2.2  0.7 - 4.0 K/uL    Monocytes Relative 5  3 - 12 %     Monocytes Absolute 0.6  0.1 - 1.0 K/uL    Eosinophils Relative 1  0 - 5 %    Eosinophils Absolute 0.1  0.0 - 0.7 K/uL    Basophils Relative 0  0 - 1 %    Basophils Absolute 0.0  0.0 - 0.1 K/uL   HEPATIC FUNCTION PANEL     Status: Abnormal   Collection Time   12/31/11  6:08 AM      Component Value Range Comment   Total Protein 8.0  6.0 - 8.3 g/dL    Albumin 4.2  3.5 - 5.2 g/dL    AST 16  0 - 37 U/L    ALT 12  0 - 53 U/L    Alkaline Phosphatase 198 (*) 39 - 117 U/L    Total Bilirubin 0.2 (*) 0.3 - 1.2 mg/dL    Bilirubin, Direct <0.1  0.0 - 0.3 mg/dL    Indirect Bilirubin NOT CALCULATED  0.3 - 0.9 mg/dL   LIPASE, BLOOD     Status: Normal   Collection Time   12/31/11  6:08 AM      Component Value Range Comment   Lipase 17  11 - 59 U/L   POCT I-STAT, CHEM 8     Status: Abnormal   Collection Time   12/31/11  6:18 AM      Component Value Range Comment   Sodium 139  135 - 145 mEq/L    Potassium 3.6  3.5 - 5.1 mEq/L    Chloride 102  96 - 112 mEq/L    BUN 5 (*) 6 - 23 mg/dL    Creatinine, Ser 0.27  0.50 - 1.35 mg/dL    Glucose, Bld 253 (*) 70 - 99 mg/dL    Calcium, Ion 6.64  4.03 - 1.23 mmol/L    TCO2 23  0 - 100 mmol/L    Hemoglobin 14.6  13.0 - 17.0 g/dL    HCT 47.4  25.9 - 56.3 %    US Abdomen Complete  12/31/2011  *RADIOLOGY REPORT*  Clinical Data:  Right upper quadrant pain  COMPLETE ABDOMINAL ULTRASOUND  Comparison:  10/25/2010  Findings:  Gallbladder:  There are multiple small gallstones dependent within the gallbladder, under 1 cm in size.  No wall thickening.  The patient does have a sonographic Murphy's sign however.  No pericholecystic fluid.  Common bile duct:  Upper limits of normal at 7 mm.  No ductal stone identified however.  Liver:  Normal echogenicity.  9 mm simple cysts in the central portion.  1 cm hyperechoic focus in the left lobe likely to represent a small  hemangioma.  IVC:  Normal  Pancreas:  Normal  Spleen:  Normal at 6.5 cm.  Right Kidney:  Normal 11.8 cm.  No  cyst, mass, stone or hydronephrosis.  Normal echogenicity.  Left Kidney:  Similarly normal at 12.3 cm.  Abdominal aorta:  No aneurysm.  No ascites  IMPRESSION: Gallstones and sludge in the gallbladder.  Sonographic Murphy's sign.  Findings could be consistent with early cholecystitis. Common duct is at the upper limits of normal, but a ductal stone is not identified.   Original Report Authenticated By: Thomasenia Sales, M.D. ( 12/31/2011 07:34:04 )     Review of Systems  Constitutional: Negative.   HENT: Positive for congestion (with allergies).   Eyes: Negative.        Glasses  Respiratory: Positive for shortness of breath (He gets short of breath with some exertion). Negative for cough, hemoptysis, sputum production and wheezing.   Cardiovascular: Negative for chest pain, palpitations, orthopnea, claudication, leg swelling and PND.       Does not lie down at night because of discomfort he cant really describe.  Gastrointestinal: Positive for abdominal pain (Right side going to his back). Negative for heartburn, nausea, vomiting, diarrhea, constipation, blood in stool and melena.  Genitourinary: Negative.   Musculoskeletal:       Right leg and hip hurt and limit mobility, he has to use a cane.  Skin: Negative.   Neurological: Negative.   Endo/Heme/Allergies: Negative.   Psychiatric/Behavioral: Positive for depression (worse since his fathers death in 05-06-13Mother died about 2-3 years ago.) and substance abuse (History of ETOH use, heavy in past, ongoing tobacco use.). Negative for hallucinations. The patient is nervous/anxious (Worse recently, not sure how long) and has insomnia.     Blood pressure 150/76, temperature 98 F (36.7 C), temperature source Oral, resp. rate 22, SpO2 99.00%. Physical Exam  Constitutional: He is oriented to person, place, and time. He appears well-developed and well-nourished. He appears distressed (ongong pain RUQ going to back).  HENT:  Head: Normocephalic  and atraumatic.  Nose: Nose normal.  Eyes: Conjunctivae and EOM are normal. Pupils are equal, round, and reactive to light. Right eye exhibits no discharge. Left eye exhibits no discharge. No scleral icterus.  Neck: Normal range of motion. Neck supple. No JVD present. No tracheal deviation present. No thyromegaly present.  Cardiovascular: Normal rate, regular rhythm, normal heart sounds and intact distal pulses.  Exam reveals no gallop.   No murmur heard. Respiratory: Effort normal and breath sounds normal. No respiratory distress. He has no wheezes. He has no rales. He exhibits no tenderness.  GI: Soft. Bowel sounds are normal. He exhibits no distension and no mass. There is tenderness (Severly tender RUQ). There is guarding (RUQ). There is no rebound.  Musculoskeletal: Normal range of motion. He exhibits no edema and no tenderness.  Lymphadenopathy:    He has no cervical adenopathy.  Neurological: He is alert and oriented to person, place, and time. He has normal reflexes. No cranial nerve deficit.       He seems to have memory issues, and his sister is with him.  This does not seem to be new.  Skin: Skin is warm and dry. No rash noted. No erythema.  Psychiatric: He has a normal mood and affect. His behavior is normal. Judgment and thought content normal.     Assessment/Plan 1. Cholelithiasis with acute cholecystitis. 2. History of seizures none for 20 years on chronic Dilantin. 3. History of anxiety and  depression; some memory issues. On chronic Zoloft. 4. History of alcohol use 5. Ongoing tobacco use, 80+ pack year.  Plan: He's been seen and evaluated by Dr. Janee Morn, who recommended he be admitted and undergo cholecystectomy with intraoperative cholangiogram. Risk and benefits have been discussed his sister and his minister with, everyone is in agreement. Emmet Messer 12/31/2011, 9:09 AM

## 2011-12-31 NOTE — ED Provider Notes (Signed)
History     CSN: [id number]  Arrival date & time 12/31/11  0556   First MD Initiated Contact with Patient 12/31/11 [phone]      Chief Complaint  Patient presents with  . Abdominal Pain    (Consider location/radiation/quality/duration/timing/severity/associated sxs/prior treatment) Patient is a 51 y.o. male presenting with abdominal pain. The history is provided by the patient and a relative. No language interpreter was used.  Abdominal Pain The primary symptoms of the illness include abdominal pain and nausea. The primary symptoms of the illness do not include fever, shortness of breath or vomiting. The current episode started 13 to 24 hours ago. The onset of the illness was gradual. The problem has been gradually worsening.  The illness is associated with alcohol use and NSAID use. Additional symptoms associated with the illness include diaphoresis. Symptoms associated with the illness do not include chills, constipation, hematuria or back pain. Significant associated medical issues include gallstones. Significant associated medical issues do not include GERD, diabetes, diverticulitis or cardiac disease.     51 year old patient coming in with right upper quadrant pain that is chronic. Patient has been ultrasound in the past and he does have gallstones. Patient has positive Murphy sign today. States that the pain started around 9 PM last night. He has had nausea but no vomiting. Last bowel movement was yesterday and it was normal. Sister at the bedside states that he also had pain last weekend but did not come to the hospital. Patient was here in June for the same pain. She did not followup with the surgeon. Past medical history of depression which is on Zoloft for, seizures which she is on Dilantin for, and right lower extremity surgery. Patient is a smoker and he drinks alcohol on Saturday nights.    Past Medical History  Diagnosis Date  . Gall stones   . Depression   . Seizures   .  Anxiety     History reviewed. No pertinent past surgical history.  History reviewed. No pertinent family history.  History  Substance Use Topics  . Smoking status: Current Everyday Smoker  . Smokeless tobacco: Not on file  . Alcohol Use: Yes      Review of Systems  Constitutional: Positive for diaphoresis. Negative for fever and chills.  HENT: Negative.   Eyes: Negative.   Respiratory: Negative.  Negative for shortness of breath.   Cardiovascular: Negative.   Gastrointestinal: Positive for nausea and abdominal pain. Negative for vomiting, constipation, blood in stool and abdominal distention.  Genitourinary: Negative for hematuria.  Musculoskeletal: Negative for back pain.  Neurological: Negative.   Psychiatric/Behavioral: Negative.   All other systems reviewed and are negative.    Allergies  Review of patient's allergies indicates no known allergies.  Home Medications   Current Outpatient Rx  Name Route Sig Dispense Refill  . CETIRIZINE HCL 10 MG PO TABS Oral Take 10 mg by mouth 2 (two) times daily.    . IBUPROFEN 200 MG PO TABS Oral Take 600 mg by mouth every 6 (six) hours as needed. For pain    . PHENYTOIN SODIUM EXTENDED 100 MG PO CAPS Oral Take 100 mg by mouth 2 (two) times daily.    . SERTRALINE HCL 100 MG PO TABS Oral Take 100 mg by mouth daily.      BP 150/76  Temp 98 F (36.7 C) (Oral)  Resp 22  SpO2 99%  Physical Exam  Nursing note and vitals reviewed. Constitutional: He is oriented to person, place, and  time. He appears well-developed and well-nourished.  HENT:  Head: Normocephalic.  Eyes: Conjunctivae and EOM are normal. Pupils are equal, round, and reactive to light.  Neck: Normal range of motion. Neck supple.  Cardiovascular: Normal rate.   Pulmonary/Chest: Effort normal and breath sounds normal. No respiratory distress. He has no wheezes.  Abdominal: Soft. Bowel sounds are normal. He exhibits no distension and no mass. There is tenderness.  There is guarding. There is no rebound.       ruq tender  Musculoskeletal: Normal range of motion.  Neurological: He is alert and oriented to person, place, and time.  Skin: Skin is warm and dry.  Psychiatric: He has a normal mood and affect.    ED Course  Procedures (including critical care time) pmh reviewed.  Nursing notes reviewed. Preop chest and ekg added.    Labs Reviewed  CBC WITH DIFFERENTIAL - Abnormal; Notable for the following:    WBC 12.8 (*)     Neutro Abs 9.8 (*)     All other components within normal limits  HEPATIC FUNCTION PANEL - Abnormal; Notable for the following:    Alkaline Phosphatase 198 (*)     Total Bilirubin 0.2 (*)     All other components within normal limits  POCT I-STAT, CHEM 8 - Abnormal; Notable for the following:    BUN 5 (*)     Glucose, Bld 121 (*)     All other components within normal limits  LIPASE, BLOOD  URINALYSIS, ROUTINE W REFLEX MICROSCOPIC   US Abdomen Complete  12/31/2011  *RADIOLOGY REPORT*  Clinical Data:  Right upper quadrant pain  COMPLETE ABDOMINAL ULTRASOUND  Comparison:  10/25/2010  Findings:  Gallbladder:  There are multiple small gallstones dependent within the gallbladder, under 1 cm in size.  No wall thickening.  The patient does have a sonographic Murphy's sign however.  No pericholecystic fluid.  Common bile duct:  Upper limits of normal at 7 mm.  No ductal stone identified however.  Liver:  Normal echogenicity.  9 mm simple cysts in the central portion.  1 cm hyperechoic focus in the left lobe likely to represent a small hemangioma.  IVC:  Normal  Pancreas:  Normal  Spleen:  Normal at 6.5 cm.  Right Kidney:  Normal 11.8 cm.  No cyst, mass, stone or hydronephrosis.  Normal echogenicity.  Left Kidney:  Similarly normal at 12.3 cm.  Abdominal aorta:  No aneurysm.  No ascites  IMPRESSION: Gallstones and sludge in the gallbladder.  Sonographic Murphy's sign.  Findings could be consistent with early cholecystitis. Common duct is at  the upper limits of normal, but a ductal stone is not identified.   Original Report Authenticated By: Thomasenia Sales, M.D. ( 12/31/2011 07:34:04 )      No diagnosis found.    MDM   51 yo male with acute/chronic cholecystitis.  Will be admitted per Dr. Janee Morn for cholecystectomy.  WBC 12.8.  Alk phos 198.  RUQ pain not controlled with dilaudid.  U/s shows gallstones and sludge.  Patient agrees and ready to be admitted.  VSS afebrile.         Remi Haggard, NP 12/31/11 [phone]

## 2011-12-31 NOTE — Anesthesia Procedure Notes (Signed)
Procedure Name: Intubation Date/Time: 12/31/2011 11:29 AM Performed by: Elon Alas Pre-anesthesia Checklist: Patient identified, Timeout performed, Emergency Drugs available, Suction available and Patient being monitored Patient Re-evaluated:Patient Re-evaluated prior to inductionOxygen Delivery Method: Circle system utilized Preoxygenation: Pre-oxygenation with 100% oxygen Intubation Type: IV induction, Rapid sequence and Cricoid Pressure applied Laryngoscope Size: Mac and 4 Grade View: Grade IV Tube type: Oral Tube size: 8.0 mm Number of attempts: 1 Airway Equipment and Method: Stylet Placement Confirmation: positive ETCO2,  ETT inserted through vocal cords under direct vision and breath sounds checked- equal and bilateral Secured at: 24 cm Tube secured with: Tape Dental Injury: Teeth and Oropharynx as per pre-operative assessment

## 2011-12-31 NOTE — Op Note (Signed)
12/31/2011  12:32 PM  PATIENT:  Aaron Vang  51 y.o. male  PRE-OPERATIVE DIAGNOSIS:  Acute cholecystitis  POST-OPERATIVE DIAGNOSIS:  Acute cholecystitis with hydrops  PROCEDURE:  Procedure(s): LAPAROSCOPIC CHOLECYSTECTOMY WITH INTRAOPERATIVE CHOLANGIOGRAM  SURGEON:  Surgeon(s): Liz Malady, MD  PHYSICIAN ASSISTANT:   ASSISTANTS: none   ANESTHESIA:   local and general  EBL:  Total I/O In: 800 [I.V.:800] Out: 25 [Blood:25]  BLOOD ADMINISTERED:none  DRAINS: none   SPECIMEN:  Excision  DISPOSITION OF SPECIMEN:  PATHOLOGY  COUNTS:  YES  DICTATION: .Dragon DictationPatient presented to the emergency room with evidence of acute cholecystitis. He has had previous attack. He was evaluated and brought for urgent cholecystectomy. Informed consent was obtained. Patient was identified in the preop holding area. He received intravenous antibiotics. He was brought to the operating room and general endotracheal anesthesia was administered by the anesthesia staff. His abdomen was prepped and draped in sterile fashion. We did time out procedure. Infraumbilical incision was made after infiltrating with local. Subcutaneous tissues were dissected down revealing the anterior fascia. This was divided sharply along the midline. Peritoneal cavity was entered under direct vision. 0 Vicryl pursestring suture was placed on the fascial opening. Hassan trocar was inserted into the abdomen. Abdomen was insufflated with carbon dioxide in standard fashion. Laparoscopic exploration revealed a tense distended and inflamed gallbladder. 5 mm epigastric and 25 mm right port were placed. Local was used at each port site. The gallbladder was very tense. It was drained with a Nahzat drainage system. This showed clear bile consistent with hydrops. Now the dome the gallbladder was retracted superior medially and the infundibulum was retracted inferior laterally. Filmy adhesions around the infundibulum were swept  away. This included some filmy adhesions to the duodenum which were carefully swept down.Dissection began laterally and progressed medially identified the cystic duct. This dissection continued until we had a critical view between the cystic duct the liver and the gallbladder. Once this was obtained a clip was placed on the infundibular cystic duct junction. Small nick was made in the cystic duct. Cook cholangiogram catheter was inserted. Intraoperative cholangiogram demonstrated no common bile filling defects and good flow of contrast into the duodenum.Cholangiocatheter was removed and 3 clips were placed proximally on the cystic duct. It was divided. Further dissection revealed the cystic artery. This was clipped twice proximally and divided distally with cautery. Gallbladder was taken off the liver bed with Bovie cautery. Another small branch vessel was clipped once. Gallbladder was raised in an Endo Catch bag and taken of the abdomen via the infraumbilical port site.Liver bed was irrigated and hemostasis was ensured. Clips remain in excellent position. There was no further bleeding. Irrigation fluid returned clear. Ports removed under direct vision. Pneumoperitoneum was released. Infraumbilical fascia was closed by tying the 0 Vicryl pursestring suture with care not to trap the intra-abdominal contents. All 4 wounds were copiously irrigated and the skin of each was closed with running 4-0 Vicryl followed by Dermabond. All counts were correct. he tolerated procedure well and was taken recovery in stable condition. There were no apparent complications.  PATIENT DISPOSITION:  PACU - hemodynamically stable.   Delay start of Pharmacological VTE agent (>24hrs) due to surgical blood loss or risk of bleeding:  no  Violeta Gelinas, MD, MPH, FACS Pager: [phone]  8/19/201312:32 PM

## 2012-01-01 ENCOUNTER — Encounter (HOSPITAL_COMMUNITY): Payer: Self-pay | Admitting: General Practice

## 2012-01-01 DIAGNOSIS — R569 Unspecified convulsions: Secondary | ICD-10-CM

## 2012-01-01 HISTORY — DX: Unspecified convulsions: R56.9

## 2012-01-01 MED ORDER — ACETAMINOPHEN 325 MG PO TABS: 650.0000 mg | ORAL_TABLET | Freq: Four times a day (QID) | ORAL | Status: AC | PRN

## 2012-01-01 MED ORDER — HYDROCODONE-ACETAMINOPHEN 5-325 MG PO TABS: 1.0000 | ORAL_TABLET | ORAL | Status: AC | PRN

## 2012-01-01 NOTE — Discharge Summary (Signed)
Myrl Bynum, MD, MPH, FACS Pager: [phone]  

## 2012-01-01 NOTE — Progress Notes (Signed)
1 Day Post-Op  Subjective: I want to go home,  Sounds like he hasn't had a pain pill, and really needs a cigarette.  Objective: Vital signs in last 24 hours: Temp:  [97 F (36.1 C)-98.3 F (36.8 C)] 98.3 F (36.8 C) (08/20 0541) Pulse Rate:  [68-88] 83  (08/20 0541) Resp:  [16-35] 20  (08/20 0541) BP: (91-149)/(60-82) 123/81 mmHg (08/20 0541) SpO2:  [93 %-100 %] 100 % (08/20 0541) Last BM Date: 12/30/11  240 PO recorded, diet: Clears, afebrile, VSS, no labs  Intake/Output from previous day: 08/19 0701 - 08/20 0700 In: 1865 [P.O.:240; I.V.:1625] Out: 1075 [Urine:1050; Blood:25] Intake/Output this shift: Total I/O In: 240 [P.O.:240] Out: 500 [Urine:500]  General appearance: alert, cooperative and no distress GI: tolerating clears, wounds look good, no discomfort on plalpation  Lab Results:   Basename 12/31/11 0618 12/31/11 0608  WBC -- 12.8*  HGB 14.6 14.1  HCT 43.0 40.4  PLT -- 187    BMET  Basename 12/31/11 0618  NA 139  K 3.6  CL 102  CO2 --  GLUCOSE 121*  BUN 5*  CREATININE 0.70  CALCIUM --   PT/INR No results found for this basename: LABPROT:2,INR:2 in the last 72 hours   Lab 12/31/11 0608  AST 16  ALT 12  ALKPHOS 198*  BILITOT 0.2*  PROT 8.0  ALBUMIN 4.2     Lipase     Component Value Date/Time   LIPASE 17 12/31/2011 1610     Studies/Results: Dg Chest 2 View  12/31/2011  *RADIOLOGY REPORT*  Clinical Data: Abdominal pain.  Preoperative evaluation for gallbladder surgery.  CHEST - 2 VIEW  Comparison: None.  Findings: Heart size is normal.  Mediastinal shadows are normal. The lungs are clear except for a small scar in the lingula or right middle lobe.  No effusions.  No bony abnormalities.  IMPRESSION: No active disease.   Original Report Authenticated By: Thomasenia Sales, M.D. ( 12/31/2011 10:13:25 )    Dg Cholangiogram Operative  12/31/2011  *RADIOLOGY REPORT*  Clinical Data: Cholecystectomy  INTRAOPERATIVE CHOLANGIOGRAM  Comparison:   Ultrasound 08/19  Findings: Contrast is injected through the cystic duct remnant.  No filling defects.  No ductal dilatation.  No obstruction.  IMPRESSION: Normal operative cholangiogram   Original Report Authenticated By: Thomasenia Sales, M.D. ( 12/31/2011 13:00:25 )    US Abdomen Complete  12/31/2011  *RADIOLOGY REPORT*  Clinical Data:  Right upper quadrant pain  COMPLETE ABDOMINAL ULTRASOUND  Comparison:  10/25/2010  Findings:  Gallbladder:  There are multiple small gallstones dependent within the gallbladder, under 1 cm in size.  No wall thickening.  The patient does have a sonographic Murphy's sign however.  No pericholecystic fluid.  Common bile duct:  Upper limits of normal at 7 mm.  No ductal stone identified however.  Liver:  Normal echogenicity.  9 mm simple cysts in the central portion.  1 cm hyperechoic focus in the left lobe likely to represent a small hemangioma.  IVC:  Normal  Pancreas:  Normal  Spleen:  Normal at 6.5 cm.  Right Kidney:  Normal 11.8 cm.  No cyst, mass, stone or hydronephrosis.  Normal echogenicity.  Left Kidney:  Similarly normal at 12.3 cm.  Abdominal aorta:  No aneurysm.  No ascites  IMPRESSION: Gallstones and sludge in the gallbladder.  Sonographic Murphy's sign.  Findings could be consistent with early cholecystitis. Common duct is at the upper limits of normal, but a ductal stone is not identified.  Original Report Authenticated By: Thomasenia Sales, M.D. ( 12/31/2011 07:34:04 )     Medications:    . ciprofloxacin  400 mg Intravenous Q12H  . HYDROmorphone      . HYDROmorphone      .  HYDROmorphone (DILAUDID) injection  1 mg Intravenous Once  . nicotine  14 mg Transdermal Daily  . ondansetron (ZOFRAN) IV  4 mg Intravenous Once  . phenytoin  100 mg Oral BID  . sertraline  100 mg Oral Daily  . DISCONTD: ciprofloxacin  400 mg Intravenous Q12H    Assessment/Plan Acute cholecystitis with cholelithiasis  2. History of seizures none for 20 years on chronic Dilantin.    3. History of anxiety and depression; some memory issues. On chronic Zoloft.  4. History of alcohol use  5. Ongoing tobacco use, 80+ pack year.   Plan:  Regular diet and home if he does well after that.     LOS: 1 day    Donika Butner 01/01/2012

## 2012-01-01 NOTE — Progress Notes (Signed)
I encouraged patient to quit smoking - he does not want to Patient examined and I agree with the assessment and plan  Violeta Gelinas, MD, MPH, FACS Pager: [phone]  01/01/2012 10:13 AM

## 2012-01-01 NOTE — Progress Notes (Signed)
Patient discharged to home with wife.  Discharge instructions completed including medications, follow up care and signs and symptoms of infection.  Verbalizes understanding with no further questions.  Tolerated regular diet for lunch with no complaints of nausea. VSS, discharged by wheelchair with wife.

## 2012-01-01 NOTE — Care Management Note (Signed)
    Page 1 of 1   01/01/2012     12:53:05 PM   CARE MANAGEMENT NOTE 01/01/2012  Patient:  Aaron Vang, Aaron Vang   Account Number:  [id number]  Date Initiated:  01/01/2012  Documentation initiated by:  Shamaya Kauer  Subjective/Objective Assessment:   Pt is a 51 yr old admitted with cholecystitis and cholelithiasis     Action/Plan:   Continue to follow for CM/discharge planning needs   Anticipated DC Date:  01/02/2012   Anticipated DC Plan:  HOME/SELF CARE      DC Planning Services  CM consult      Choice offered to / List presented to:             Status of service:  In process, will continue to follow Medicare Important Message given?   (If response is "NO", the following Medicare IM given date fields will be blank) Date Medicare IM given:   Date Additional Medicare IM given:    Discharge Disposition:    Per UR Regulation:  Reviewed for med. necessity/level of care/duration of stay  If discussed at Long Length of Stay Meetings, dates discussed:    Comments:

## 2014-04-18 IMAGING — US US ABDOMEN COMPLETE
1 series · 14 of 25 positions shown · non-contrast
Comparison: 10/25/2010

CLINICAL DATA: Right upper quadrant pain

COMPLETE ABDOMINAL ULTRASOUND

[Series 1: us abdomen complete · 0.23mm/px · 14 of 76 slices shown]
[im 1/76]
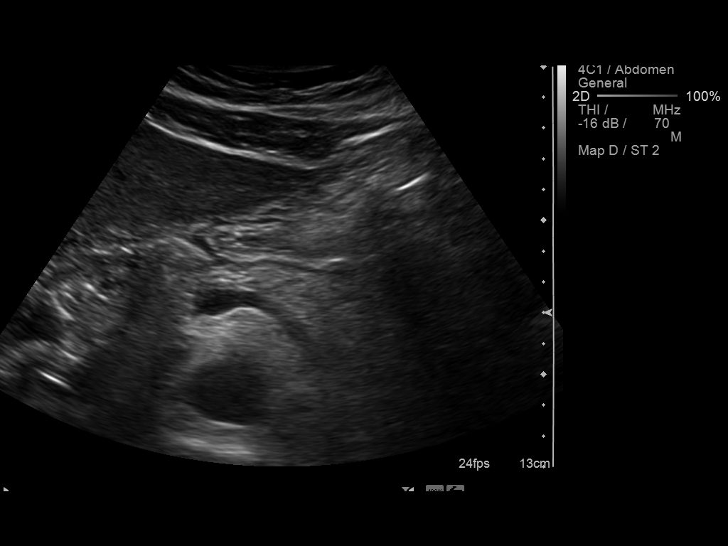
[im 7/76]
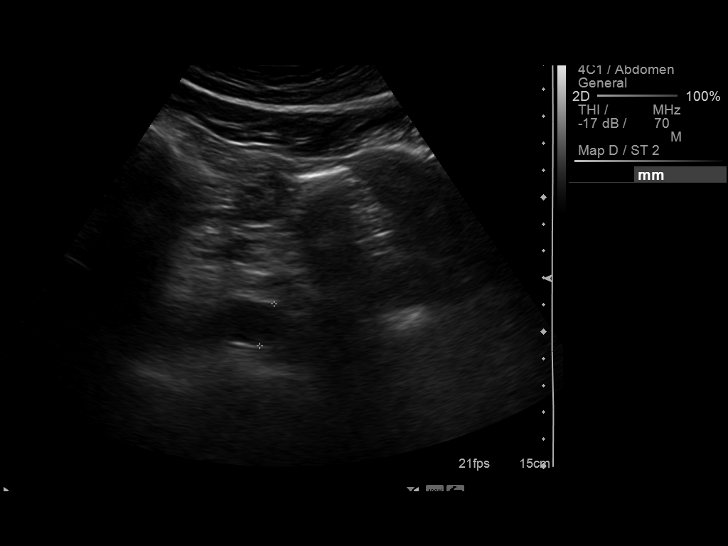
[im 13/76]
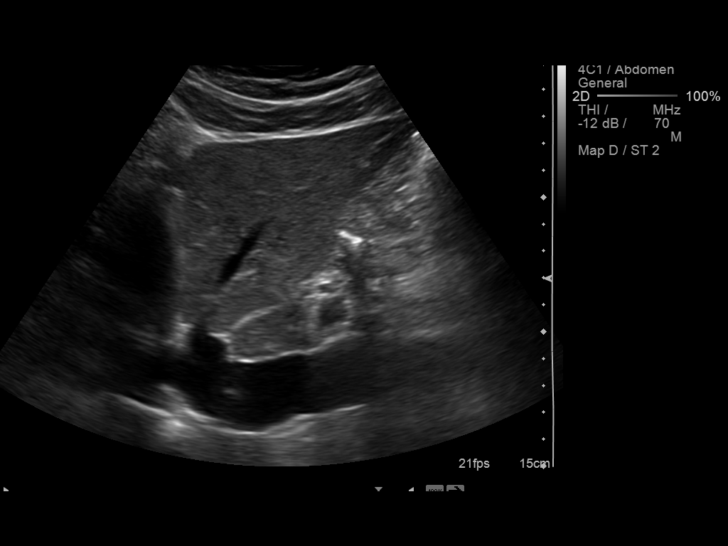
[im 19/76]
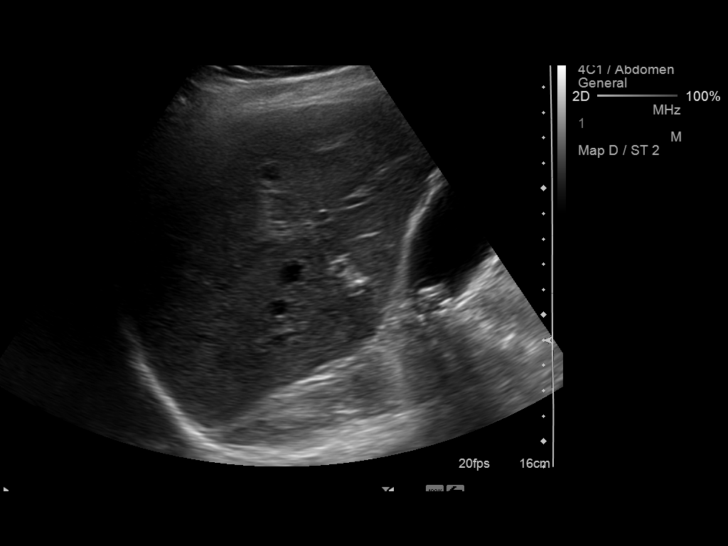
[im 26/76]
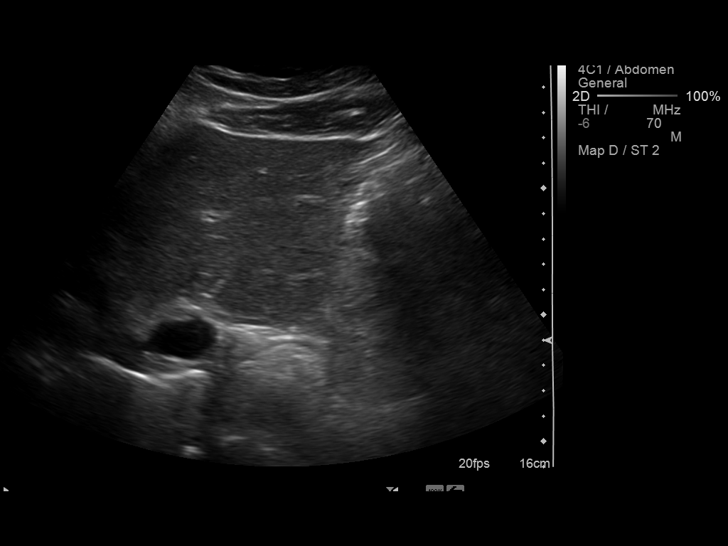
[im 29/76]
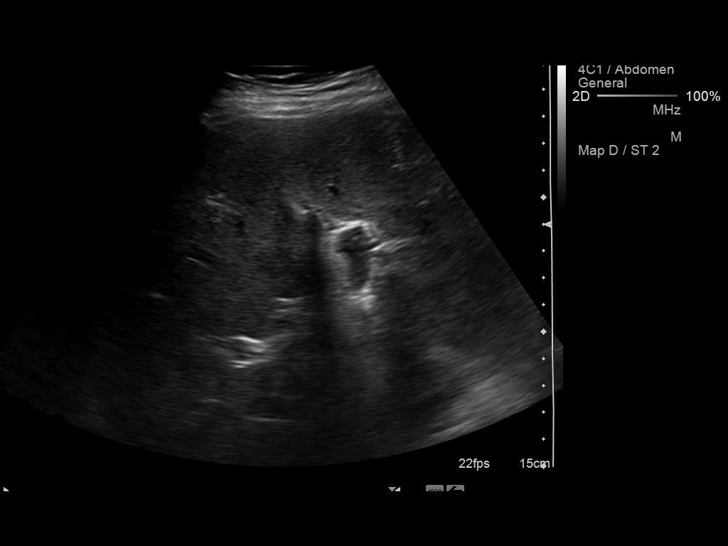
[im 35/76]
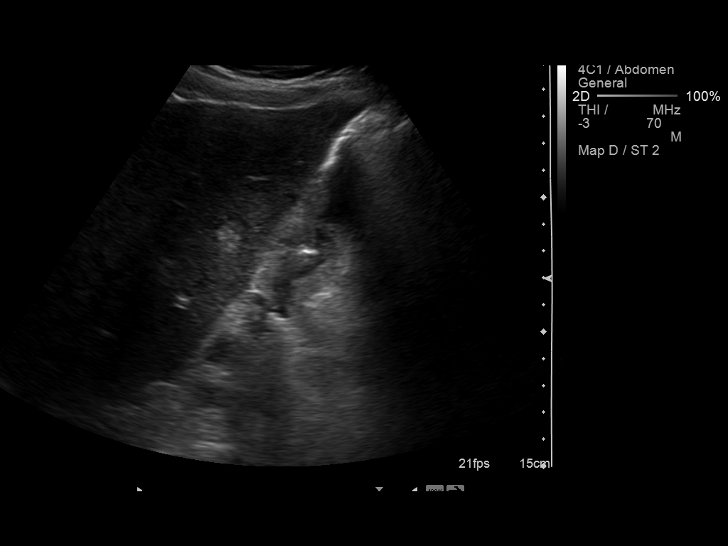
[im 41/76]
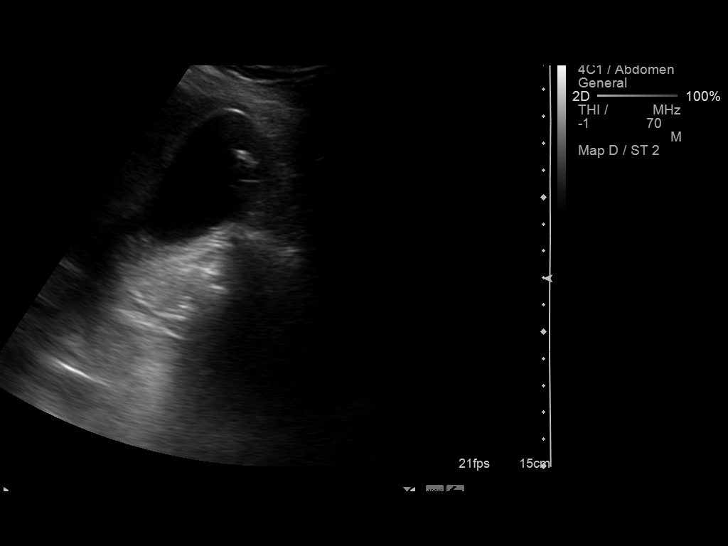
[im 47/76]
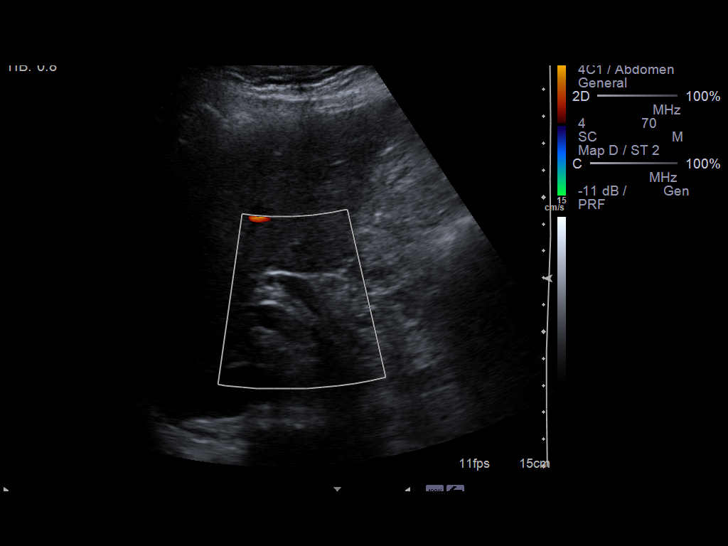
[im 51/76]
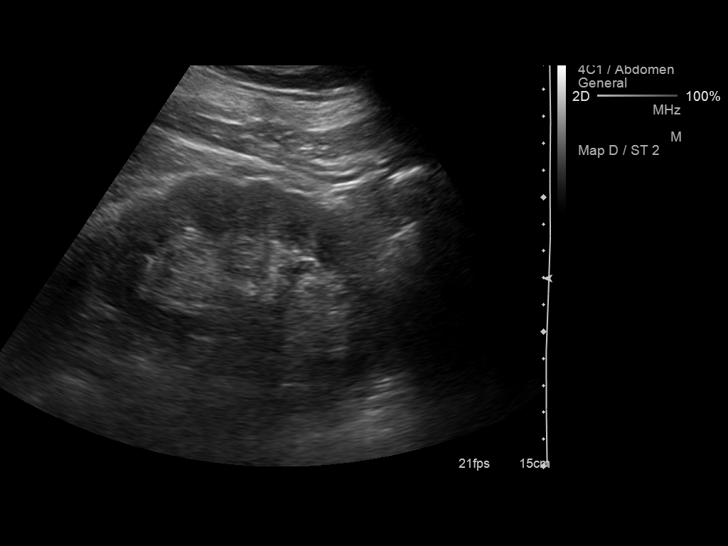
[im 57/76]
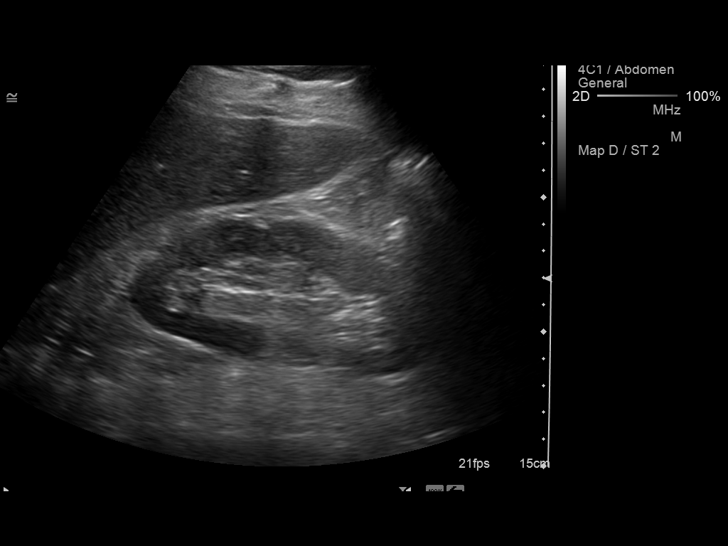
[im 63/76]
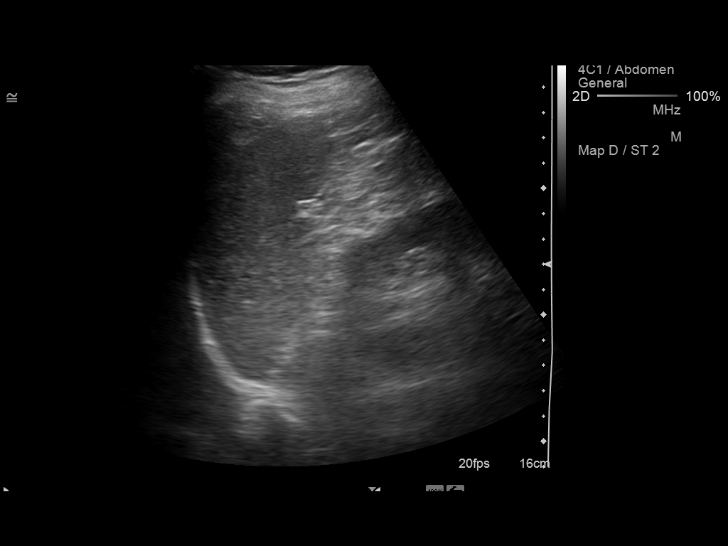
[im 69/76]
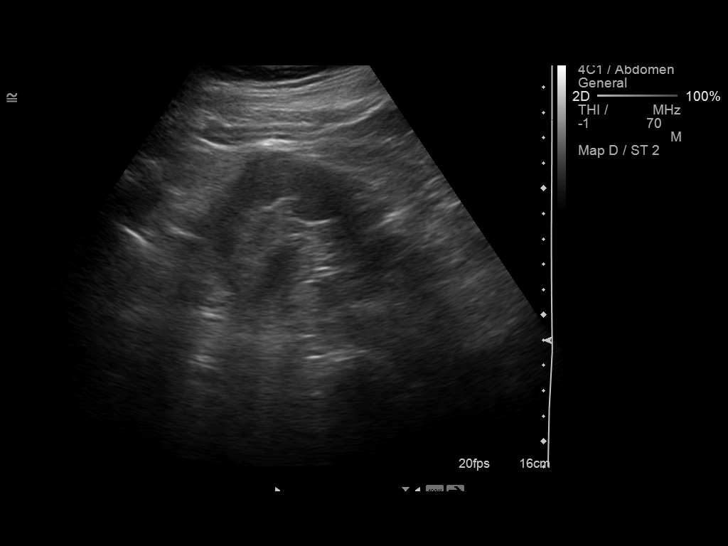
[im 76/76]
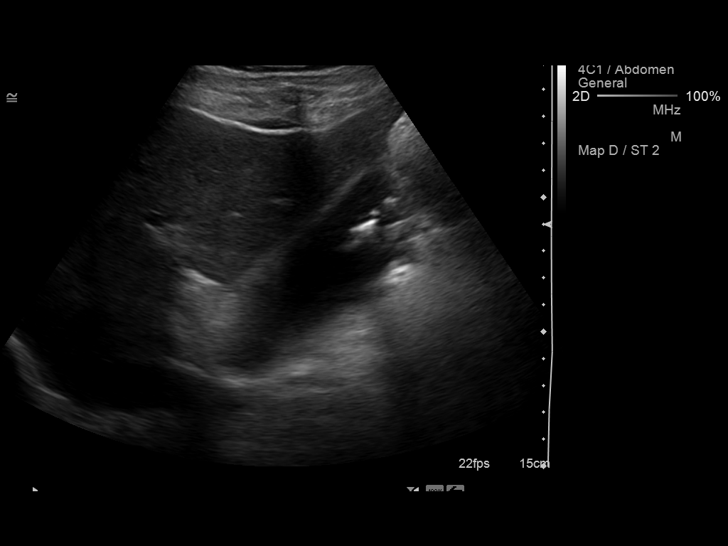

[14 of 25 positions shown; findings below may reference images not displayed]

FINDINGS: Gallbladder:  There are multiple small gallstones dependent within
the gallbladder, under 1 cm in size.  No wall thickening.  The
patient does have a sonographic Murphy's sign however.  No
pericholecystic fluid.

Common bile duct:  Upper limits of normal at 7 mm.  No ductal stone
identified however.

Liver:  Normal echogenicity.  9 mm simple cysts in the central
portion.  1 cm hyperechoic focus in the left lobe likely to
represent a small hemangioma.

IVC:  Normal

Pancreas:  Normal

Spleen:  Normal at 6.5 cm.

Right Kidney:  Normal 11.8 cm.  No cyst, mass, stone or
hydronephrosis.  Normal echogenicity.

Left Kidney:  Similarly normal at 12.3 cm.

Abdominal aorta:  No aneurysm.

No ascites
IMPRESSION: Gallstones and sludge in the gallbladder.  Sonographic Murphy's
sign.  Findings could be consistent with early cholecystitis.
Common duct is at the upper limits of normal, but a ductal stone is
not identified.

## 2014-04-18 IMAGING — CR DG CHEST 2V
2 series · 2 of 2 positions shown · non-contrast
Comparison: None.

CLINICAL DATA: Abdominal pain.  Preoperative evaluation for
gallbladder surgery.

CHEST - 2 VIEW

[w chest pa]
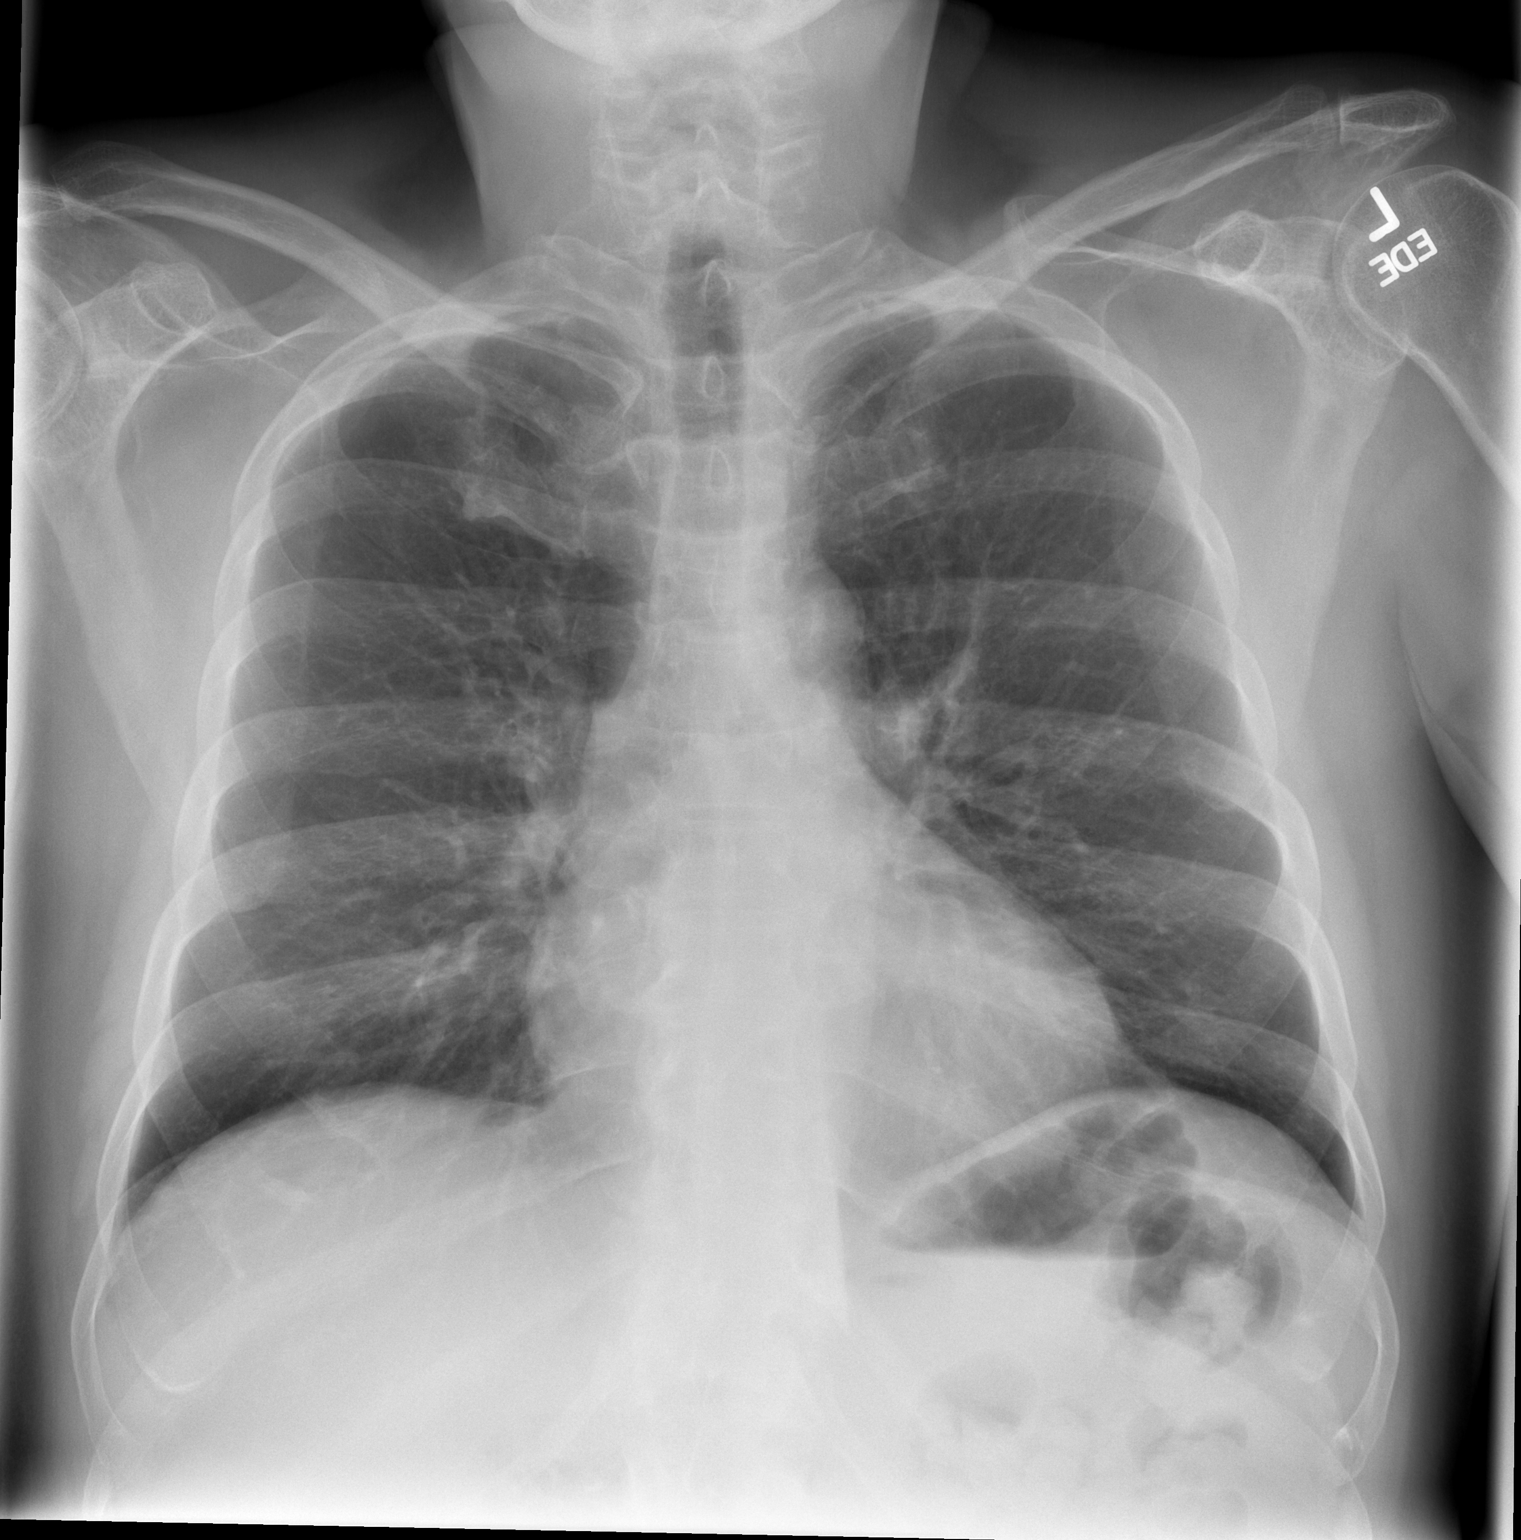

[w chest lat]
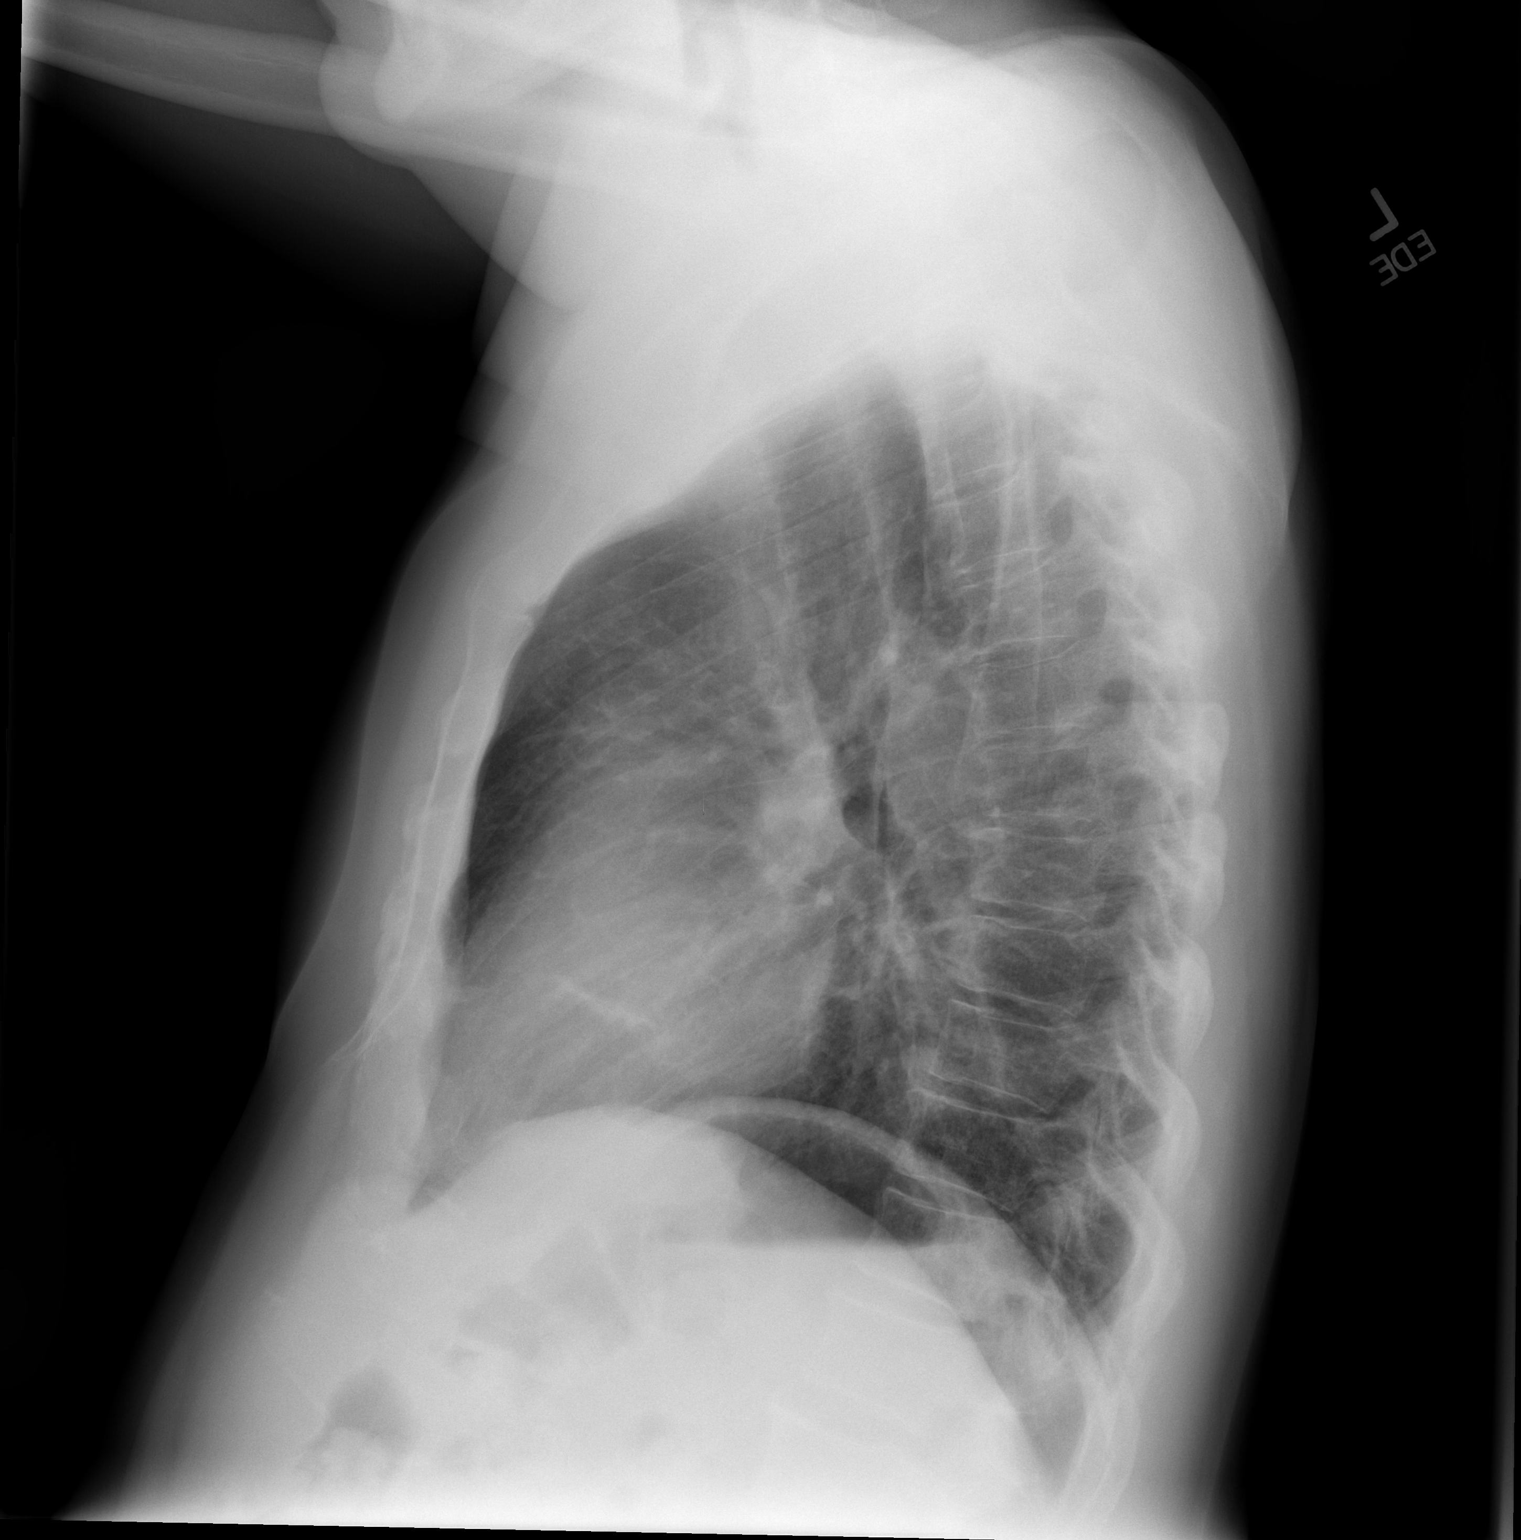

[2 of 2 positions shown; findings below may reference images not displayed]

FINDINGS: Heart size is normal.  Mediastinal shadows are normal.
The lungs are clear except for a small scar in the lingula or right
middle lobe.  No effusions.  No bony abnormalities.
IMPRESSION: No active disease.

## 2015-11-13 ENCOUNTER — Emergency Department (HOSPITAL_COMMUNITY)
Admission: EM | Admit: 2015-11-13 | Discharge: 2015-11-13 | Disposition: A | Discharge status: Home / Self Care | Payer: Medicare Other | Attending: Emergency Medicine | Admitting: Emergency Medicine

## 2015-11-13 ENCOUNTER — Encounter (HOSPITAL_COMMUNITY): Payer: Self-pay | Admitting: Emergency Medicine

## 2015-11-13 DIAGNOSIS — Z79899 Other long term (current) drug therapy: Secondary | ICD-10-CM | POA: Insufficient documentation

## 2015-11-13 DIAGNOSIS — J029 Acute pharyngitis, unspecified: Secondary | ICD-10-CM | POA: Diagnosis not present

## 2015-11-13 DIAGNOSIS — R208 Other disturbances of skin sensation: Secondary | ICD-10-CM | POA: Diagnosis not present

## 2015-11-13 DIAGNOSIS — F329 Major depressive disorder, single episode, unspecified: Secondary | ICD-10-CM | POA: Diagnosis not present

## 2015-11-13 DIAGNOSIS — Z791 Long term (current) use of non-steroidal anti-inflammatories (NSAID): Secondary | ICD-10-CM | POA: Insufficient documentation

## 2015-11-13 DIAGNOSIS — F17210 Nicotine dependence, cigarettes, uncomplicated: Secondary | ICD-10-CM | POA: Insufficient documentation

## 2015-11-13 MED ORDER — MAGIC MOUTHWASH W/LIDOCAINE: 15.0000 mL | Freq: Three times a day (TID) | ORAL | Status: AC | PRN

## 2015-11-13 MED ORDER — MAGIC MOUTHWASH
15.0000 mL | Freq: Once | ORAL | Status: AC
  Administered 2015-11-13: 15 mL via ORAL
  Filled 2015-11-13: qty 15

## 2015-11-13 NOTE — ED Provider Notes (Signed)
CSN: [id number]     Arrival date & time 11/13/15  1912 History   First MD Initiated Contact with Patient 11/13/15 1947     Chief Complaint  Patient presents with  . Oral Swelling     (Consider location/radiation/quality/duration/timing/severity/associated sxs/prior Treatment) HPI  55 year old male with history of tobacco use who presents with sore throat. States for the past few days his mouth and throat has been "on fire." States that smoking has irritated the burning. This morning complained of some tightness in his throat, but denies difficulty breathing or swelling in his throat. Has had post nasal drainage and mild non-productive cough. No difficulty swallowing secretions, but when he swallows feels pain and burning. NO fever or chills. No new allergens or foods.   Denies history of indigestion or GERD symptoms.   Past Medical History  Diagnosis Date  . Gall stones   . Depression   . Anxiety   . Cholecystitis, acute with cholelithiasis 12/31/2011  . Alcohol use (HCC) 12/31/2011  . Tobacco use 12/31/2011  . Seizures (HCC) 01/01/2012    "had them when I was a kid; none for > 20 yrs"   Past Surgical History  Procedure Laterality Date  . Cholecystectomy  12/31/2011  . 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 Cholecystectomy  12/31/2011    Procedure: LAPAROSCOPIC CHOLECYSTECTOMY WITH INTRAOPERATIVE CHOLANGIOGRAM;  Surgeon: Liz MaladyBurke E Thompson, MD;  [facility];  Service: General;  Laterality: N/A;   Family History  Problem Relation Age of Onset  . Cancer Mother   . Stroke Mother   . Heart attack Mother   . Stroke Father    Social History  Substance Use Topics  . Smoking status: Current Every Day Smoker -- 2.00 packs/day for 43 years    Types: Cigarettes  . Smokeless tobacco: Never Used     Comment: 01/01/2012 offered smoking cessation materials; pt declines"  . Alcohol Use: 7.2 oz/week    12 Cans of beer per week     Comment:  01/01/2012 "before 2003; 1 case q 2-3 days; since then, maybe 12pk/wk"    Review of Systems 10/14 systems reviewed and are negative other than those stated in the HPI    Allergies  Review of patient's allergies indicates no known allergies.  Home Medications   Prior to Admission medications   Medication Sig Start Date End Date Taking? Authorizing Provider  cetirizine (ZYRTEC ALLERGY) 10 MG tablet Take 10 mg by mouth 2 (two) times daily.   Yes Historical Provider, MD  dextromethorphan (DELSYM) 30 MG/5ML liquid Take 30 mg by mouth as needed for cough.   Yes Historical Provider, MD  ibuprofen (ADVIL,MOTRIN) 200 MG tablet Take 800 mg by mouth every 6 (six) hours as needed. For pain   Yes Historical Provider, MD  phenytoin (DILANTIN) 100 MG ER capsule Take 200 mg by mouth at bedtime.    Yes Historical Provider, MD  sertraline (ZOLOFT) 100 MG tablet Take 100 mg by mouth daily.   Yes Historical Provider, MD  magic mouthwash w/lidocaine SOLN Take 15 mLs by mouth 3 (three) times daily as needed for mouth pain. 11/13/15   Lavera Guiseana Duo Raelene Trew, MD   BP 146/95 mmHg  Pulse 66  Temp(Src) 97.7 F (36.5 C) (Oral)  Resp 18  SpO2 97% Physical Exam  Physical Exam  Nursing note and vitals reviewed. Constitutional: thin appearing, non-toxic, and in no acute distress Head: Normocephalic and atraumatic.  Mouth/Throat: Oropharynx is clear and moist. ? Slight enlarged uvula. No difficulty handling secretions.  Neck: Normal range of motion. Neck supple.  Cardiovascular: Normal rate and regular rhythm.   Pulmonary/Chest: Effort normal and breath sounds normal.  Abdominal: Soft. There is no tenderness. There is no rebound and no guarding.  Musculoskeletal: Normal range of motion.  Neurological: Alert, no facial droop, fluent speech, moves all extremities symmetrically Skin: Skin is warm and dry.  Psychiatric: Cooperative  ED Course  Procedures (including critical care time) Labs Review Labs Reviewed - No data  to display  Imaging Review No results found. I have personally reviewed and evaluated these images and lab results as part of my medical decision-making.   EKG Interpretation None      MDM   Final diagnoses:  Burning sensation of mouth  Sore throat    55 year old male who presents with burning sensation in his mouth, tongue, and throat. Is well-appearing in no acute distress. Denies any true oral swelling. Handling secretions without difficulty, breathing comfortably on room air. Oropharynx is clear w/ ? Of slightly enlarged uvula but he states he does not feel swelling or irritation of his uvula. Given magic mouthwash and all symptoms resolved. ? If smoking, ill fitting dentures may cause burning mouth syndrome. No GERD symptoms. Discussed cutting down tobacco use and supportive care with magic mouthwash. Strict return and follow-up instructions reviewed. He expressed understanding of all discharge instructions and felt comfortable with the plan of care.    Lavera Guiseana Duo Yulian Gosney, MD 11/13/15 2056

## 2015-11-13 NOTE — ED Notes (Signed)
Pt states this morning his throat was hurting and burning and when he tried to put his teeth in it felt like they were hitting something in the back of his throat causing him to gag   Pt has swelling noted to his uvula  Pt states he is able to swallow but it hurts

## 2015-11-13 NOTE — Discharge Instructions (Signed)
Return for worsening symptoms, including difficulty breathing, sensation of throat swelling, unable to swallow saliva or any other symptoms concerning to you. Cut back on smoking as it may be irritating your mucous membranes in the mouth.    Sore Throat A sore throat is pain, burning, irritation, or scratchiness of the throat. There is often pain or tenderness when swallowing or talking. A sore throat may be accompanied by other symptoms, such as coughing, sneezing, fever, and swollen neck glands. A sore throat is often the first sign of another sickness, such as a cold, flu, strep throat, or mononucleosis (commonly known as mono). Most sore throats go away without medical treatment. CAUSES  The most common causes of a sore throat include:  A viral infection, such as a cold, flu, or mono.  A bacterial infection, such as strep throat, tonsillitis, or whooping cough.  Seasonal allergies.  Dryness in the air.  Irritants, such as smoke or pollution.  Gastroesophageal reflux disease (GERD). HOME CARE INSTRUCTIONS   Only take over-the-counter medicines as directed by your caregiver.  Drink enough fluids to keep your urine clear or pale yellow.  Rest as needed.  Try using throat sprays, lozenges, or sucking on hard candy to ease any pain (if older than 4 years or as directed).  Sip warm liquids, such as broth, herbal tea, or warm water with honey to relieve pain temporarily. You may also eat or drink cold or frozen liquids such as frozen ice pops.  Gargle with salt water (mix 1 tsp salt with 8 oz of water).  Do not smoke and avoid secondhand smoke.  Put a cool-mist humidifier in your bedroom at night to moisten the air. You can also turn on a hot shower and sit in the bathroom with the door closed for 5-10 minutes. SEEK IMMEDIATE MEDICAL CARE IF:  You have difficulty breathing.  You are unable to swallow fluids, soft foods, or your saliva.  You have increased swelling in the  throat.  Your sore throat does not get better in 7 days.  You have nausea and vomiting.  You have a fever or persistent symptoms for more than 2-3 days.  You have a fever and your symptoms suddenly get worse. MAKE SURE YOU:   Understand these instructions.  Will watch your condition.  Will get help right away if you are not doing well or get worse.   This information is not intended to replace advice given to you by your health care provider. Make sure you discuss any questions you have with your health care provider.   Document Released: 06/07/2004 Document Revised: 05/21/2014 Document Reviewed: 01/06/2012 [employer] Interactive Patient Education [employer]

## 2022-11-08 ENCOUNTER — Other Ambulatory Visit: Payer: Self-pay | Admitting: Family Medicine

## 2022-11-08 DIAGNOSIS — S0990XA Unspecified injury of head, initial encounter: Secondary | ICD-10-CM

## 2022-11-19 ENCOUNTER — Other Ambulatory Visit: Payer: Medicare Other

## 2022-11-19 DIAGNOSIS — S0990XA Unspecified injury of head, initial encounter: Secondary | ICD-10-CM

## 2022-11-19 MED ORDER — GADOPICLENOL 0.5 MMOL/ML IV SOLN
7.5000 mL | Freq: Once | INTRAVENOUS | Status: AC | PRN
  Administered 2022-11-19: 7.5 mL via INTRAVENOUS

## 2022-12-26 ENCOUNTER — Other Ambulatory Visit: Payer: Medicare Other

## 2024-04-01 ENCOUNTER — Encounter: Payer: Self-pay | Admitting: Neurology

## 2024-06-10 ENCOUNTER — Ambulatory Visit: Admitting: Neurology

## 2024-07-29 ENCOUNTER — Ambulatory Visit: Admitting: Neurology
